# Patient Record
Sex: Female | Born: 1935 | Race: White | Hispanic: No | Smoking: Former smoker
Health system: Southern US, Community
[De-identification: ages and names within clinical notes are randomized; demographics above are authoritative.]

## PROBLEM LIST (undated history)

## (undated) ENCOUNTER — Emergency Department (HOSPITAL_COMMUNITY): Admission: EM | Disposition: A | Discharge status: Home / Self Care | Payer: Medicare PPO

## (undated) DIAGNOSIS — I10 Essential (primary) hypertension: Secondary | ICD-10-CM

## (undated) DIAGNOSIS — C50919 Malignant neoplasm of unspecified site of unspecified female breast: Secondary | ICD-10-CM

## (undated) DIAGNOSIS — I4891 Unspecified atrial fibrillation: Secondary | ICD-10-CM

## (undated) HISTORY — PX: MASTECTOMY: SHX3

## (undated) HISTORY — DX: Essential (primary) hypertension: I10

## (undated) HISTORY — PX: ABDOMINAL HYSTERECTOMY: SHX81

## (undated) HISTORY — DX: Malignant neoplasm of unspecified site of unspecified female breast: C50.919

---

## 1988-01-20 DIAGNOSIS — C50919 Malignant neoplasm of unspecified site of unspecified female breast: Secondary | ICD-10-CM | POA: Insufficient documentation

## 1988-01-20 HISTORY — DX: Malignant neoplasm of unspecified site of unspecified female breast: C50.919

## 1998-03-03 ENCOUNTER — Other Ambulatory Visit: Admission: RE | Admit: 1998-03-03 | Discharge: 1998-03-03 | Payer: Self-pay | Admitting: *Deleted

## 2000-04-06 ENCOUNTER — Encounter: Payer: Self-pay | Admitting: *Deleted

## 2000-04-06 ENCOUNTER — Encounter: Admission: RE | Admit: 2000-04-06 | Discharge: 2000-04-06 | Payer: Self-pay | Admitting: *Deleted

## 2000-10-05 ENCOUNTER — Encounter: Admission: RE | Admit: 2000-10-05 | Discharge: 2000-10-05 | Payer: Self-pay | Admitting: Internal Medicine

## 2000-10-05 ENCOUNTER — Encounter: Payer: Self-pay | Admitting: Internal Medicine

## 2000-10-26 ENCOUNTER — Encounter: Payer: Self-pay | Admitting: Internal Medicine

## 2000-10-26 ENCOUNTER — Encounter: Admission: RE | Admit: 2000-10-26 | Discharge: 2000-10-26 | Payer: Self-pay | Admitting: Internal Medicine

## 2001-04-18 ENCOUNTER — Encounter: Payer: Self-pay | Admitting: Internal Medicine

## 2001-04-18 ENCOUNTER — Encounter: Admission: RE | Admit: 2001-04-18 | Discharge: 2001-04-18 | Payer: Self-pay | Admitting: Internal Medicine

## 2002-04-30 ENCOUNTER — Encounter: Payer: Self-pay | Admitting: Internal Medicine

## 2002-04-30 ENCOUNTER — Encounter: Admission: RE | Admit: 2002-04-30 | Discharge: 2002-04-30 | Payer: Self-pay | Admitting: Internal Medicine

## 2002-07-03 ENCOUNTER — Ambulatory Visit (HOSPITAL_COMMUNITY): Admission: RE | Admit: 2002-07-03 | Discharge: 2002-07-03 | Payer: Self-pay | Admitting: Gastroenterology

## 2003-06-04 ENCOUNTER — Encounter: Admission: RE | Admit: 2003-06-04 | Discharge: 2003-06-04 | Payer: Self-pay | Admitting: Internal Medicine

## 2003-06-04 ENCOUNTER — Encounter: Payer: Self-pay | Admitting: Internal Medicine

## 2003-09-11 ENCOUNTER — Emergency Department (HOSPITAL_COMMUNITY): Admission: EM | Admit: 2003-09-11 | Discharge: 2003-09-11 | Payer: Self-pay | Admitting: Emergency Medicine

## 2004-06-07 ENCOUNTER — Encounter: Admission: RE | Admit: 2004-06-07 | Discharge: 2004-06-07 | Payer: Self-pay | Admitting: Surgery

## 2004-10-07 ENCOUNTER — Emergency Department (HOSPITAL_COMMUNITY): Admission: EM | Admit: 2004-10-07 | Discharge: 2004-10-07 | Payer: Self-pay | Admitting: Family Medicine

## 2004-10-09 ENCOUNTER — Emergency Department (HOSPITAL_COMMUNITY): Admission: AD | Admit: 2004-10-09 | Discharge: 2004-10-09 | Payer: Self-pay | Admitting: Family Medicine

## 2005-06-27 ENCOUNTER — Encounter: Admission: RE | Admit: 2005-06-27 | Discharge: 2005-06-27 | Payer: Self-pay | Admitting: Internal Medicine

## 2006-03-15 ENCOUNTER — Emergency Department (HOSPITAL_COMMUNITY): Admission: EM | Admit: 2006-03-15 | Discharge: 2006-03-15 | Payer: Self-pay | Admitting: Emergency Medicine

## 2006-07-11 ENCOUNTER — Encounter: Admission: RE | Admit: 2006-07-11 | Discharge: 2006-07-11 | Payer: Self-pay | Admitting: Surgery

## 2007-07-23 ENCOUNTER — Encounter: Admission: RE | Admit: 2007-07-23 | Discharge: 2007-07-23 | Payer: Self-pay | Admitting: Internal Medicine

## 2008-07-29 ENCOUNTER — Encounter: Admission: RE | Admit: 2008-07-29 | Discharge: 2008-07-29 | Payer: Self-pay | Admitting: Internal Medicine

## 2008-10-15 ENCOUNTER — Inpatient Hospital Stay (HOSPITAL_COMMUNITY): Admission: EM | Admit: 2008-10-15 | Discharge: 2008-10-17 | Payer: Self-pay | Admitting: Emergency Medicine

## 2009-09-01 ENCOUNTER — Encounter: Admission: RE | Admit: 2009-09-01 | Discharge: 2009-09-01 | Payer: Self-pay | Admitting: Surgery

## 2010-09-06 ENCOUNTER — Encounter
Admission: RE | Admit: 2010-09-06 | Discharge: 2010-09-06 | Payer: Self-pay | Source: Home / Self Care | Attending: Surgery | Admitting: Surgery

## 2010-12-14 LAB — BASIC METABOLIC PANEL
BUN: 10 mg/dL (ref 6–23)
BUN: 11 mg/dL (ref 6–23)
CO2: 27 mEq/L (ref 19–32)
CO2: 27 mEq/L (ref 19–32)
Calcium: 8 mg/dL — ABNORMAL LOW (ref 8.4–10.5)
Chloride: 92 mEq/L — ABNORMAL LOW (ref 96–112)
Chloride: 99 mEq/L (ref 96–112)
Creatinine, Ser: 0.66 mg/dL (ref 0.4–1.2)
Creatinine, Ser: 0.69 mg/dL (ref 0.4–1.2)
GFR calc Af Amer: >60 mL/min (ref >60)
Potassium: 4.1 mEq/L (ref 3.5–5.1)

## 2010-12-14 LAB — URINALYSIS, ROUTINE W REFLEX MICROSCOPIC
Bilirubin Urine: NEGATIVE
Bilirubin Urine: NEGATIVE
Hgb urine dipstick: NEGATIVE
Nitrite: NEGATIVE
Protein, ur: NEGATIVE mg/dL
Specific Gravity, Urine: 1.013 (ref 1.005–1.030)
Urobilinogen, UA: 0.2 mg/dL (ref 0.0–1.0)
pH: 8 (ref 5.0–8.0)

## 2010-12-14 LAB — CBC
HCT: 32 % — ABNORMAL LOW (ref 36.0–46.0)
Hemoglobin: 11.8 g/dL — ABNORMAL LOW (ref 12.0–15.0)
MCHC: 34.7 g/dL (ref 30.0–36.0)
MCHC: 34.8 g/dL (ref 30.0–36.0)
MCHC: 35 g/dL (ref 30.0–36.0)
MCV: 93.9 fL (ref 78.0–100.0)
Platelets: 173 10*3/uL (ref 150–400)
Platelets: 181 10*3/uL (ref 150–400)
RBC: 3.41 MIL/uL — ABNORMAL LOW (ref 3.87–5.11)
RBC: 3.65 MIL/uL — ABNORMAL LOW (ref 3.87–5.11)
RBC: 4.06 MIL/uL (ref 3.87–5.11)
WBC: 6.9 10*3/uL (ref 4.0–10.5)
WBC: 8.2 10*3/uL (ref 4.0–10.5)

## 2010-12-14 LAB — DIFFERENTIAL
Basophils Absolute: 0.1 10*3/uL (ref 0.0–0.1)
Basophils Relative: 2 % — ABNORMAL HIGH (ref 0–1)
Eosinophils Absolute: 0.1 10*3/uL (ref 0.0–0.7)
Lymphocytes Relative: 15 % (ref 12–46)
Lymphs Abs: 0.9 10*3/uL (ref 0.7–4.0)
Lymphs Abs: 1.3 10*3/uL (ref 0.7–4.0)
Monocytes Relative: 8 % (ref 3–12)
Neutrophils Relative %: 74 % (ref 43–77)

## 2010-12-14 LAB — COMPREHENSIVE METABOLIC PANEL
ALT: 20 U/L (ref 0–35)
AST: 26 U/L (ref 0–37)
Albumin: 3.2 g/dL — ABNORMAL LOW (ref 3.5–5.2)
BUN: 10 mg/dL (ref 6–23)
CO2: 28 mEq/L (ref 19–32)
Calcium: 8.3 mg/dL — ABNORMAL LOW (ref 8.4–10.5)
Calcium: 9 mg/dL (ref 8.4–10.5)
Chloride: 86 mEq/L — ABNORMAL LOW (ref 96–112)
Creatinine, Ser: 0.76 mg/dL (ref 0.4–1.2)
GFR calc Af Amer: >60 mL/min (ref >60)
GFR calc non Af Amer: >60 mL/min (ref >60)
GFR calc non Af Amer: >60 mL/min (ref >60)
Glucose, Bld: 110 mg/dL — ABNORMAL HIGH (ref 70–99)
Sodium: 124 mEq/L — ABNORMAL LOW (ref 135–145)

## 2010-12-14 LAB — PROTIME-INR
INR: 1.2 (ref 0.00–1.49)
Prothrombin Time: 15.2 seconds (ref 11.6–15.2)

## 2010-12-14 LAB — URINE CULTURE: Culture: NO GROWTH

## 2010-12-14 LAB — CORTISOL: Cortisol, Plasma: 24.5 ug/dL

## 2010-12-14 LAB — URINE MICROSCOPIC-ADD ON

## 2011-01-11 NOTE — Discharge Summary (Signed)
NAMEMOE, Wolfe NO.:  [id number]   MEDICAL RECORD NO.:  [id number]          PATIENT TYPE:  INP   LOCATION:  3731                         FACILITY:  [facility]   PHYSICIAN:  Thora Lance, M.D.  DATE OF BIRTH:  [birth year]   DATE OF ADMISSION:  10/14/2008  DATE OF DISCHARGE:  10/17/2008                               DISCHARGE SUMMARY   REASON FOR ADMISSION:  This is a 77 year old white female who presented  with back pain for several days prior to admission.  She went to the  emergency room where an x-ray showed possible L2 acute compression  fracture.  She also had some constipation.   SIGNIFICANT FINDINGS:  VITAL SIGNS:  Temperature 97.8, blood pressure  197/89, down to 164/72, heart rate 95, and respirations 18.  LUNGS:  Clear.  HEART:  Regular rate and rhythm without murmur, gallop, or rub.  ABDOMEN:  Soft, nontender, and nondistended.  EXTREMITIES:  No edema.  BACK:  Mild vertebral tenderness around the upper lumbar spine.   LABORATORY DATA:  White blood cell count 8.2, hemoglobin 13.3.  Sodium  124, potassium 4.5, creatinine 0.76.  Chest x-ray showed osteopenia,  otherwise no acute disease.  Lumbar spine showed an L2 fracture of  thoracic spine.  X-ray showed loss of height at T8.  CT scan of the  abdomen and pelvis showed no acute findings other than obstipation.   HOSPITAL COURSE:  1. Back pain.  The patient was admitted with back pain.  She had a      probable acute L2 osteoporosis related compression fracture.  She      has had history of severe osteoporosis with compression fractures      in the past.  In the hospital her pain was relatively mild.  It was      treated successfully with Ultram and heat.  She was able to      ambulate without difficulty on the day prior to admission by      physical therapy.  Further physical therapy was not recommended.      The patient will be treated conservatively at home.  2. Constipation.  The patient was  given MiraLax and Colace and had      several bowel movements during the hospitalization.  3. Hyponatremia.  The patient was given normal saline with gradual      improvement in her hyponatremia.  The cause of her hyponatremia is      felt to be related to hydrochlorothiazide and this was      discontinued.  Her TSH was borderline elevated at 5.6 and will need      to be followed as an outpatient.  4. Multifocal atrial tachycardia.  The patient had MAT on admission,      which has been a rhythm she has had in the past.  This converted to      normal sinus rhythm and remains the same throughout the      hospitalization.   DISCHARGE DIAGNOSES:  1. L2 osteoporosis related compression fracture.  2. Severe osteoporosis.  3. Constipation.  4. Hyponatremia.  5. Multifocal atrial tachycardia.  6. Hypertension.   PROCEDURES:  None.   DISCHARGE MEDICATIONS:  1. Amlodipine 5 mg p.o. daily.  2. Lisinopril 20 mg p.o. daily.  3. Metoprolol 50 mg one-half daily.  4. Digoxin 0.25 mg one-half daily.  5. Calcium plus D 600 mg twice a day.  6. Alendronate 70 mg once a week.  7. Evista 60 mg once a day.  8. Aspirin 81 mg once a day.  9. Tramadol 50 mg one q.i.d. p.r.n. for pain.  10.Colace 100 mg b.i.d.  11.MiraLax 17 g with water daily p.r.n. for constipation.   DISPOSITION:  Discharged to home.   FOLLOWUP:  On February 24, with Dr. Valentina Lucks.   CODE STATUS:  Full code.   DIET:  Low-sodium diet.           ______________________________  Thora Lance, M.D.     JJG/MEDQ  D:  10/17/2008  T:  10/17/2008  Job:  [id number]

## 2011-01-11 NOTE — H&P (Signed)
NAMESHAMONA, WIRTZ                  ACCOUNT NO.:  [id number]   MEDICAL RECORD NO.:  [id number]          PATIENT TYPE:  INP   LOCATION:  1857                         FACILITY:  [facility]   PHYSICIAN:  Michiel Cowboy, MDDATE OF BIRTH:  [birth year]   DATE OF ADMISSION:  10/14/2008  DATE OF DISCHARGE:                              HISTORY & PHYSICAL   PRIMARY CARE Osiah Haring:  Thora Lance, M.D.   CHIEF COMPLAINT:  Back pain.   HISTORY OF THE PRESENT ILLNESS:  The patient is an 77 year old female  with a past medical history of severe osteoporosis and hypertension who  presents with complaints if back pain.  The patient does not recall any  history of back injury, but started having back pain for the past few  days.  She presented to her primary care Tisheena Maguire, but was not able to  get much of relief.  She continued to have severe back pain and went to  the emergency department where images of her back showed possible L2  acute fracture and questionable T8 fracture.  The L2 is  more likely to  be the acute one.  She states sometimes the pain in her back radiates to  her right upper abdomen.  It is mostly related to movement.  There is no  history of pleurisy, no shortness of breath, no chest pain, no fevers,  and no chills.  Of note, the patient had dropped a piece of wood on her  anterior right leg and supposedly this was cleaned by Urgent Care.  However, while she was putting her pants on, she pulled on the scab and  took off a large portion on the scab on her leg, which now has some  drainage from the injury.   REVIEW OF SYSTEMS:  Otherwise the review of systems is negative except  for that noted in the HPI.   PAST MEDICAL HISTORY:  The past medical history is significant for:  1. Osteoporosis.  2. Hypertension.  3. Arthritis.   SOCIAL HISTORY:  The patient does not smoke or use drugs.  She lives at  home alone.  She has a son who is very attentive.   ALLERGIES:  No known drug  allergies.   MEDICATIONS:  1. Fosamax 70 mg every weekly.  2. Norvasc 5 mg daily.  3. Evista; dose unknown.  4. Lisinopril; dose unknown.  The patient may be confused about the      name of the medications.  Her son will bring them in tomorrow.   PHYSICAL EXAMINATION:  VITAL SIGNS:  Temperature 97.8.  Blood pressure  was 197/89 and is now down to 164/72.  Pulse initially was 108 and is  now down to 95.  Respirations are 18 and she is satting at 97% on room  air.  GENERAL APPEARANCE:  The patient appears to be in no acute distress.  She is comfortable lying down, but gets pain if she tries to ambulate.  HEENT:  Head is atraumatic.  Moist mucous membranes.  LUNGS:  The lungs are clear to auscultation bilaterally.  HEART:  The heart has a regular rate and rhythm.  No murmurs are  appreciated.  ABDOMEN:  The abdomen is soft, nontender and nondistended.  EXTREMITIES:  The lower extremities are without clubbing or cyanosis,  but there is mild edema of the right lower extremity around the wound  with about an 8 x 3 cm scrape with some yellowish discharge.  BACK:  There is a vertebral tenderness around the lumbar spine noted,  but this is mild.  NEUROLOGIC EXAMINATION:  Neurologically she appears to be intact.   LABORATORY DATA:  White blood count 8.2 and hemoglobin 13.3.  Sodium  124, potassium 4.5 and creatinine 0.76.  LFTs are within normal limits.  UA shows 3-6 white blood cells and rare bacteria, but nothing definitive  for a urinary tract infection.  Chest x-ray showed no osteopenia and  otherwise no acute cardiopulmonary disease.  Lumbar spine showed an L2  fracture.  Thoracic spine showed loss of height at T8.  CT scan of the  abdomen and pelvis showed acute versus subacute L2 fracture and  obstipation.   ASSESSMENT AND PLAN:  This is an 77 year old female with a lumbar 2 and  possibly a thoracic 8 fracture with pain and a mild scrape on her leg  that could be superficially  infected, and obstipation.  1. Lumbar 2 fracture.  I will consult radiology in the morning for      possible vertebroplasty.  In the meanwhile we will control pain      with morphine and Ultram.  2. Obstipation.  We will use Colace and MiraLax.  If no improvement we      may need to give her an enema.  The patient has had regular bowel      movements, but they were low in volume and she appears to be still      very constipated.  3. Hypertension.  We will continue Norvasc for now and we will      increase the dose since she is hypertensive probably secondary to      pain.  We will need to get clarification of her home medications of      what she is suppose to be taking.  4. Prophylaxes.  Protonix and sequential compressive devices until      radiology can tell us if, and when vertebroplasty can be done.  5. Hyponatremia.  Given the patient was mildly tachycardiac initially      she may be slightly dehydrated.  We will check orthostatics and      give gentle intravenous fluids.  We will check urine electrolytes,      thyroid stimulating hormone and cortisol levels.  Sometimes      syndrome of inappropriate antidiuretic hormone could be secondary      to severe pain.  The patient does not appear to be in as much      pain.  Her hyponatremia could be also chronic.   Dr. Kirby Funk will assume the patient's care in the morning.      Michiel Cowboy, MD  Electronically Signed     AVD/MEDQ  D:  10/15/2008  T:  10/15/2008  Job:  [id number]   cc:   Thora Lance, M.D.

## 2011-01-14 NOTE — Op Note (Signed)
   Tina Wolfe, Tina Wolfe NO.:  [id number]   MEDICAL RECORD NO.:  [id number]                    PATIENT TYPE:   LOCATION:                                       FACILITY:   PHYSICIAN:  Danise Edge, M.D.                DATE OF BIRTH:   DATE OF PROCEDURE:  07/03/2002  DATE OF DISCHARGE:                                 OPERATIVE REPORT   PROCEDURE PERFORMED:  Colonoscopy.   ENDOSCOPIST:  Charolett Bumpers, M.D.   INDICATIONS FOR PROCEDURE:  The patient is a 77 year old female.  She was  born [birth year].  The patient has intermittent constipation followed by  diarrhea which has markedly improved since starting Metamucil capsules.  She  is scheduled to undergo a screening colonoscopy with polypectomy to prevent  colon cancer.   I discussed with the patient complications associated with colonoscopy and  polypectomy including a 15 per 1000 risk of bleeding and one per 1000 risk  of colon perforation requiring surgical repair.  The patient has signed the  operative permit.   PREMEDICATION:  Demerol 50 mg, Versed 7.5 mg.   INSTRUMENT USED:  Pediatric Olympus colonoscope.   DESCRIPTION OF PROCEDURE:  After obtaining informed consent, the patient was  placed in the left lateral decubitus position.  I administered intravenous  Demerol and intravenous Versed to achieve conscious sedation for the  procedure.  The patient's blood pressure, oxygen saturations and cardiac  rhythm were monitored throughout the procedure and documented in the medical  record.   Anal inspection was normal.  Digital rectal exam was normal.  The pediatric  Olympus video colonoscope was introduced into the rectum and advanced to the  cecum.  Colonic preparation for the exam today was excellent.   Rectum:  Normal.   Sigmoid colon and descending colon:  Left colonic diverticulosis.   Splenic flexure:  Normal.   Transverse colon:  Normal.   Hepatic flexure:  Normal.   Ascending colon:  Normal.   Ileocecal valve and cecum:  Normal.    ASSESSMENT:  Left colonic diverticulosis; otherwise normal proctocolonoscopy  to the cecum.   PLAN:                                                Danise Edge, M.D.    MJ/MEDQ  D:  07/03/2002  T:  07/03/2002  Job:  [id number]   cc:   Thora Lance, M.D.  [address]  [place]  [place] [address]  Fax: [phone]

## 2011-01-21 ENCOUNTER — Encounter (INDEPENDENT_AMBULATORY_CARE_PROVIDER_SITE_OTHER): Payer: Self-pay | Admitting: Surgery

## 2011-08-19 ENCOUNTER — Encounter (INDEPENDENT_AMBULATORY_CARE_PROVIDER_SITE_OTHER): Payer: Self-pay | Admitting: General Surgery

## 2011-08-25 ENCOUNTER — Encounter (INDEPENDENT_AMBULATORY_CARE_PROVIDER_SITE_OTHER): Payer: Self-pay | Admitting: Surgery

## 2011-08-29 ENCOUNTER — Ambulatory Visit (INDEPENDENT_AMBULATORY_CARE_PROVIDER_SITE_OTHER): Payer: Medicare Other | Admitting: Surgery

## 2011-08-29 ENCOUNTER — Encounter (INDEPENDENT_AMBULATORY_CARE_PROVIDER_SITE_OTHER): Payer: Self-pay | Admitting: Surgery

## 2011-08-29 ENCOUNTER — Other Ambulatory Visit (INDEPENDENT_AMBULATORY_CARE_PROVIDER_SITE_OTHER): Payer: Self-pay | Admitting: Surgery

## 2011-08-29 VITALS — BP 130/78 | HR 84 | Temp 97.8°F | Resp 14 | Ht 62.5 in | Wt 114.0 lb

## 2011-08-29 DIAGNOSIS — Z853 Personal history of malignant neoplasm of breast: Secondary | ICD-10-CM

## 2011-08-29 DIAGNOSIS — Z9011 Acquired absence of right breast and nipple: Secondary | ICD-10-CM

## 2011-08-29 DIAGNOSIS — Z1231 Encounter for screening mammogram for malignant neoplasm of breast: Secondary | ICD-10-CM

## 2011-08-29 NOTE — Progress Notes (Signed)
[name]       DOB: [birth year]           DATE: 08/29/2011       MRN: [id number]   Tina Wolfe is a 77 y.o.Marland Kitchenfemale who presents for routine followup of her Right breast cancer diagnosed in 1989 and treated with mastectomy and implant reconstruction. She has no problems or concerns on either side.  PFSH: She has had no significant changes since the last visit here.  ROS: There have been no significant changes since the last visit here  EXAM: General: The patient is alert, oriented, generally healty appearing, NAD. Mood and affect are normal.  Breasts:  Right s/p mastectomy with reconstruction. Left normal. No suggestion of a problem  Lymphatics: She has no axillary or supraclavicular adenopathy on either side.  Extremities: Full ROM of the surgical side with no lymphedema noted.  Data Reviewed: Mammogram Jan 2012 negative  Impression: Doing well, with no evidence of recurrent cancer or new cancer  Plan: RTC PRN. Continue annual mammograms.

## 2011-08-29 NOTE — Patient Instructions (Signed)
We will see you again on an as needed basis. Please call the office at [phone] if you have any questions or concerns. Thank you for allowing us to take care of you.  

## 2011-09-23 ENCOUNTER — Ambulatory Visit: Payer: Self-pay

## 2011-10-06 ENCOUNTER — Ambulatory Visit
Admission: RE | Admit: 2011-10-06 | Discharge: 2011-10-06 | Disposition: A | Discharge status: Home / Self Care | Payer: Medicare Other | Source: Ambulatory Visit | Attending: Surgery | Admitting: Surgery

## 2011-10-06 DIAGNOSIS — Z9011 Acquired absence of right breast and nipple: Secondary | ICD-10-CM

## 2011-10-06 DIAGNOSIS — Z1231 Encounter for screening mammogram for malignant neoplasm of breast: Secondary | ICD-10-CM

## 2012-09-21 ENCOUNTER — Other Ambulatory Visit (INDEPENDENT_AMBULATORY_CARE_PROVIDER_SITE_OTHER): Payer: Self-pay | Admitting: Surgery

## 2012-09-21 DIAGNOSIS — Z1231 Encounter for screening mammogram for malignant neoplasm of breast: Secondary | ICD-10-CM

## 2012-10-25 ENCOUNTER — Ambulatory Visit
Admission: RE | Admit: 2012-10-25 | Discharge: 2012-10-25 | Disposition: A | Discharge status: Home / Self Care | Payer: Medicare PPO | Source: Ambulatory Visit | Attending: Surgery | Admitting: Surgery

## 2013-10-07 ENCOUNTER — Other Ambulatory Visit: Payer: Self-pay | Admitting: Internal Medicine

## 2013-10-07 ENCOUNTER — Ambulatory Visit
Admission: RE | Admit: 2013-10-07 | Discharge: 2013-10-07 | Disposition: A | Discharge status: Home / Self Care | Payer: Medicare PPO | Source: Ambulatory Visit | Attending: Internal Medicine | Admitting: Internal Medicine

## 2013-10-07 DIAGNOSIS — M545 Low back pain, unspecified: Secondary | ICD-10-CM

## 2013-11-07 ENCOUNTER — Emergency Department (HOSPITAL_COMMUNITY)
Admission: EM | Admit: 2013-11-07 | Discharge: 2013-11-07 | Disposition: A | Discharge status: Other Acute Inpatient Hospital | Payer: Medicare PPO | Source: Home / Self Care

## 2013-11-07 ENCOUNTER — Encounter (HOSPITAL_COMMUNITY): Payer: Self-pay | Admitting: Emergency Medicine

## 2013-11-07 ENCOUNTER — Observation Stay (HOSPITAL_COMMUNITY)
Admission: EM | Admit: 2013-11-07 | Discharge: 2013-11-10 | Disposition: A | Discharge status: Home / Self Care | Payer: Medicare PPO | Attending: Internal Medicine | Admitting: Internal Medicine

## 2013-11-07 ENCOUNTER — Emergency Department (HOSPITAL_COMMUNITY): Payer: Medicare PPO

## 2013-11-07 DIAGNOSIS — I10 Essential (primary) hypertension: Secondary | ICD-10-CM | POA: Diagnosis present

## 2013-11-07 DIAGNOSIS — R002 Palpitations: Secondary | ICD-10-CM

## 2013-11-07 DIAGNOSIS — R51 Headache: Secondary | ICD-10-CM | POA: Insufficient documentation

## 2013-11-07 DIAGNOSIS — R000 Tachycardia, unspecified: Secondary | ICD-10-CM

## 2013-11-07 DIAGNOSIS — Z87891 Personal history of nicotine dependence: Secondary | ICD-10-CM | POA: Insufficient documentation

## 2013-11-07 DIAGNOSIS — IMO0002 Reserved for concepts with insufficient information to code with codable children: Secondary | ICD-10-CM | POA: Insufficient documentation

## 2013-11-07 DIAGNOSIS — Z853 Personal history of malignant neoplasm of breast: Secondary | ICD-10-CM | POA: Insufficient documentation

## 2013-11-07 DIAGNOSIS — Z79899 Other long term (current) drug therapy: Secondary | ICD-10-CM | POA: Insufficient documentation

## 2013-11-07 DIAGNOSIS — C50919 Malignant neoplasm of unspecified site of unspecified female breast: Secondary | ICD-10-CM | POA: Diagnosis present

## 2013-11-07 DIAGNOSIS — I4891 Unspecified atrial fibrillation: Secondary | ICD-10-CM

## 2013-11-07 DIAGNOSIS — R519 Headache, unspecified: Secondary | ICD-10-CM

## 2013-11-07 DIAGNOSIS — R609 Edema, unspecified: Secondary | ICD-10-CM | POA: Insufficient documentation

## 2013-11-07 LAB — COMPREHENSIVE METABOLIC PANEL
ALT: 17 U/L (ref 0–35)
AST: 25 U/L (ref 0–37)
Albumin: 3.8 g/dL (ref 3.5–5.2)
Alkaline Phosphatase: 41 U/L (ref 39–117)
BILIRUBIN TOTAL: 0.4 mg/dL (ref 0.3–1.2)
BUN: 25 mg/dL — ABNORMAL HIGH (ref 6–23)
CHLORIDE: 97 meq/L (ref 96–112)
CO2: 26 meq/L (ref 19–32)
Calcium: 9.5 mg/dL (ref 8.4–10.5)
Creatinine, Ser: 0.61 mg/dL (ref 0.50–1.10)
GFR calc Af Amer: >90 mL/min (ref >90)
GFR, EST NON AFRICAN AMERICAN: 79 mL/min — AB (ref >90)
Glucose, Bld: 128 mg/dL — ABNORMAL HIGH (ref 70–99)
POTASSIUM: 4.3 meq/L (ref 3.7–5.3)
SODIUM: 137 meq/L (ref 137–147)
Total Protein: 7.8 g/dL (ref 6.0–8.3)

## 2013-11-07 LAB — I-STAT TROPONIN, ED: Troponin i, poc: 0 ng/mL (ref 0.00–0.08)

## 2013-11-07 LAB — CBC WITH DIFFERENTIAL/PLATELET
BASOS ABS: 0 10*3/uL (ref 0.0–0.1)
Basophils Relative: 0 % (ref 0–1)
Eosinophils Absolute: 0.1 10*3/uL (ref 0.0–0.7)
Eosinophils Relative: 1 % (ref 0–5)
HEMATOCRIT: 43 % (ref 36.0–46.0)
Hemoglobin: 14.5 g/dL (ref 12.0–15.0)
LYMPHS PCT: 17 % (ref 12–46)
Lymphs Abs: 1.2 10*3/uL (ref 0.7–4.0)
MCH: 30.9 pg (ref 26.0–34.0)
MCHC: 33.7 g/dL (ref 30.0–36.0)
MCV: 91.7 fL (ref 78.0–100.0)
Monocytes Absolute: 0.7 10*3/uL (ref 0.1–1.0)
Monocytes Relative: 10 % (ref 3–12)
NEUTROS ABS: 5.3 10*3/uL (ref 1.7–7.7)
Neutrophils Relative %: 72 % (ref 43–77)
PLATELETS: 177 10*3/uL (ref 150–400)
RBC: 4.69 MIL/uL (ref 3.87–5.11)
RDW: 14.5 % (ref 11.5–15.5)
WBC: 7.3 10*3/uL (ref 4.0–10.5)

## 2013-11-07 LAB — DIGOXIN LEVEL: Digoxin Level: 0.6 ng/mL — ABNORMAL LOW (ref 0.8–2.0)

## 2013-11-07 LAB — PROTIME-INR
INR: 1.08 (ref 0.00–1.49)
PROTHROMBIN TIME: 13.8 s (ref 11.6–15.2)

## 2013-11-07 LAB — MAGNESIUM: Magnesium: 2 mg/dL (ref 1.5–2.5)

## 2013-11-07 LAB — TROPONIN I

## 2013-11-07 LAB — PRO B NATRIURETIC PEPTIDE: Pro B Natriuretic peptide (BNP): 740.7 pg/mL — ABNORMAL HIGH (ref 0–450)

## 2013-11-07 LAB — TSH: TSH: 4.316 u[IU]/mL (ref 0.350–4.500)

## 2013-11-07 MED ORDER — FLUTICASONE PROPIONATE 50 MCG/ACT NA SUSP
2.0000 | Freq: Every day | NASAL | Status: DC | PRN
  Filled 2013-11-07: qty 16

## 2013-11-07 MED ORDER — SODIUM CHLORIDE 0.9 % IJ SOLN
3.0000 mL | Freq: Two times a day (BID) | INTRAMUSCULAR | Status: DC
  Administered 2013-11-07 – 2013-11-09 (×4): 3 mL via INTRAVENOUS

## 2013-11-07 MED ORDER — SODIUM CHLORIDE 0.9 % IJ SOLN: 3.0000 mL | INTRAMUSCULAR | Status: DC | PRN

## 2013-11-07 MED ORDER — LISINOPRIL 10 MG PO TABS
10.0000 mg | ORAL_TABLET | Freq: Every day | ORAL | Status: DC
  Administered 2013-11-08 – 2013-11-10 (×3): 10 mg via ORAL
  Filled 2013-11-07 (×3): qty 1

## 2013-11-07 MED ORDER — ADULT MULTIVITAMIN W/MINERALS CH
1.0000 | ORAL_TABLET | Freq: Every day | ORAL | Status: DC
  Administered 2013-11-08 – 2013-11-10 (×3): 1 via ORAL
  Filled 2013-11-07 (×3): qty 1

## 2013-11-07 MED ORDER — ACETAMINOPHEN 500 MG PO TABS
500.0000 mg | ORAL_TABLET | Freq: Four times a day (QID) | ORAL | Status: DC | PRN
  Administered 2013-11-07: 500 mg via ORAL
  Filled 2013-11-07: qty 1

## 2013-11-07 MED ORDER — AMLODIPINE BESYLATE 5 MG PO TABS
5.0000 mg | ORAL_TABLET | Freq: Every day | ORAL | Status: DC
  Administered 2013-11-08 – 2013-11-10 (×3): 5 mg via ORAL
  Filled 2013-11-07 (×3): qty 1

## 2013-11-07 MED ORDER — DILTIAZEM LOAD VIA INFUSION
5.0000 mg | Freq: Once | INTRAVENOUS | Status: DC
  Filled 2013-11-07: qty 5

## 2013-11-07 MED ORDER — DIGOXIN 250 MCG PO TABS
250.0000 ug | ORAL_TABLET | Freq: Every day | ORAL | Status: DC
  Administered 2013-11-08 – 2013-11-10 (×3): 250 ug via ORAL
  Filled 2013-11-07 (×3): qty 1

## 2013-11-07 MED ORDER — ZOLPIDEM TARTRATE 5 MG PO TABS
5.0000 mg | ORAL_TABLET | Freq: Every evening | ORAL | Status: DC | PRN
Start: 2013-11-07 — End: 2013-11-10
  Administered 2013-11-07: 5 mg via ORAL
  Filled 2013-11-07: qty 1

## 2013-11-07 MED ORDER — TRIAMCINOLONE ACETONIDE 55 MCG/ACT NA AERO: 2.0000 | INHALATION_SPRAY | Freq: Every day | NASAL | Status: DC | PRN

## 2013-11-07 MED ORDER — SODIUM CHLORIDE 0.9 % IV SOLN
Freq: Once | INTRAVENOUS | Status: AC
  Administered 2013-11-07: 16:00:00 via INTRAVENOUS

## 2013-11-07 MED ORDER — METOPROLOL TARTRATE 25 MG PO TABS
25.0000 mg | ORAL_TABLET | Freq: Once | ORAL | Status: AC
  Administered 2013-11-07: 25 mg via ORAL

## 2013-11-07 MED ORDER — ASPIRIN 81 MG PO CHEW
324.0000 mg | CHEWABLE_TABLET | Freq: Once | ORAL | Status: AC
  Administered 2013-11-07: 324 mg via ORAL
  Filled 2013-11-07: qty 4

## 2013-11-07 MED ORDER — SODIUM CHLORIDE 0.9 % IV SOLN: 250.0000 mL | INTRAVENOUS | Status: DC | PRN

## 2013-11-07 MED ORDER — ENOXAPARIN SODIUM 60 MG/0.6ML ~~LOC~~ SOLN
1.0000 mg/kg | Freq: Two times a day (BID) | SUBCUTANEOUS | Status: DC
  Administered 2013-11-07: 55 mg via SUBCUTANEOUS
  Filled 2013-11-07 (×3): qty 0.6

## 2013-11-07 MED ORDER — RALOXIFENE HCL 60 MG PO TABS
60.0000 mg | ORAL_TABLET | Freq: Every day | ORAL | Status: DC
  Administered 2013-11-08 – 2013-11-10 (×3): 60 mg via ORAL
  Filled 2013-11-07 (×3): qty 1

## 2013-11-07 MED ORDER — SODIUM CHLORIDE 0.9 % IJ SOLN
3.0000 mL | Freq: Two times a day (BID) | INTRAMUSCULAR | Status: DC
  Administered 2013-11-08: 3 mL via INTRAVENOUS

## 2013-11-07 MED ORDER — DILTIAZEM HCL 25 MG/5ML IV SOLN
5.0000 mg | Freq: Once | INTRAVENOUS | Status: AC
  Administered 2013-11-07: 5 mg via INTRAVENOUS
  Filled 2013-11-07: qty 5

## 2013-11-07 NOTE — ED Notes (Signed)
Iv  Ns  tko   Rate  50  Ml       Via  20  Angio  l  Arm      1  Att           Site  Is  Patent      Pt is  On  Cardiac monitor  And  Nasal  o2  At  2 l /  Min

## 2013-11-07 NOTE — ED Provider Notes (Signed)
CSN: [id number]     Arrival date & time 11/07/13  1559 History   First MD Initiated Contact with Patient 11/07/13 1639     Chief Complaint  Patient presents with  . Atrial Fibrillation     (Consider location/radiation/quality/duration/timing/severity/associated sxs/prior Treatment) HPI Comments: 80 yo female with PMH of HTN, Breast CA presents to ER with cc of fast heart rate.  She was seen at [facility] today and found to be in Afib with RVR.  Transferred to ER for evaluation.    EKG at [facility] - Afib with RVR with rate at 145.    Pt reports no previous h/o Afib.    NKDA. Meds: norvasc, fosamax, lisinopril, evista    Patient is a 80 y.o. female presenting with atrial fibrillation. The history is provided by the patient and the EMS personnel.  Atrial Fibrillation This is a new problem. The current episode started 2 days ago. The problem occurs constantly. The problem has not changed since onset.Associated symptoms include headaches. Pertinent negatives include no chest pain, no abdominal pain and no shortness of breath. Nothing aggravates the symptoms. Nothing relieves the symptoms. She has tried nothing for the symptoms.    Past Medical History  Diagnosis Date  . Hypertension   . Breast cancer, right, intraductal 01/20/1988   Past Surgical History  Procedure Laterality Date  . Abdominal hysterectomy    . Mastectomy     No family history on file. History  Substance Use Topics  . Smoking status: Former Research scientist (life sciences)  . Smokeless tobacco: Not on file  . Alcohol Use: No   OB History   Grav Para Term Preterm Abortions TAB SAB Ect Mult Living                 Review of Systems  Constitutional: Negative.   Eyes: Negative.   Respiratory: Negative for cough, choking, chest tightness, shortness of breath and stridor.   Cardiovascular: Positive for palpitations. Negative for chest pain and leg swelling.  Gastrointestinal: Negative for abdominal pain.  Endocrine: Negative.   Genitourinary:  Negative.   Allergic/Immunologic: Negative.   Neurological: Positive for headaches. Negative for tremors, seizures, syncope, facial asymmetry, speech difficulty, light-headedness and numbness.      Allergies  Review of patient's allergies indicates no known allergies.  Home Medications   Current Outpatient Rx  Name  Route  Sig  Dispense  Refill  . acetaminophen (TYLENOL) 500 MG tablet   Oral   Take 500 mg by mouth every 6 (six) hours as needed for moderate pain.         Marland Kitchen amLODipine (NORVASC) 5 MG tablet   Oral   Take 5 mg by mouth daily.           Marland Kitchen [place] 250 MCG tablet   Oral   Take 250 mcg by mouth daily.         Marland Kitchen lisinopril (PRINIVIL,ZESTRIL) 10 MG tablet   Oral   Take 10 mg by mouth daily.           . metoprolol succinate (TOPROL-XL) 25 MG 24 hr tablet   Oral   Take 25 mg by mouth every morning.          . Multiple Vitamin (MULTIVITAMIN WITH MINERALS) TABS tablet   Oral   Take 1 tablet by mouth daily.         . Omega-3 Fatty Acids (FISH OIL) 1000 MG CAPS   Oral   Take 1,000 mg by mouth daily.         Marland Kitchen  raloxifene (EVISTA) 60 MG tablet   Oral   Take 60 mg by mouth daily.           Marland Kitchen triamcinolone (NASACORT ALLERGY 24HR) 55 MCG/ACT AERO nasal inhaler   Nasal   Place 2 sprays into the nose daily as needed (for allergies).          BP 172/76  Pulse 125  Temp(Src) 97.9 F (36.6 C) (Oral)  Resp 25  SpO2 100% Physical Exam  Nursing note and vitals reviewed. Constitutional: She is oriented to person, place, and time. She appears well-developed and well-nourished.  HENT:  Head: Normocephalic and atraumatic.  Eyes: Conjunctivae are normal. Right eye exhibits no discharge. Left eye exhibits no discharge.  Neck: Normal range of motion. Neck supple.  Pulmonary/Chest: Effort normal and breath sounds normal. She has no wheezes. She has no rales.  Abdominal: Soft. Bowel sounds are normal. She exhibits no distension and no mass. There is no  tenderness. There is no rebound and no guarding.  Musculoskeletal: Normal range of motion. She exhibits edema. She exhibits no tenderness.  Mild bilat LE edema  Neurological: She is alert and oriented to person, place, and time. She has normal reflexes.  Skin: Skin is warm and dry.    ED Course  Procedures (including critical care time) Labs Review Labs Reviewed  CBC WITH DIFFERENTIAL  COMPREHENSIVE METABOLIC PANEL  I-STAT [place], ED   Imaging Review No results found.   EKG Interpretation   Date/Time:  Thursday November 07 2013 16:16:52 EDT Ventricular Rate:  113 PR Interval:  161 QRS Duration: 80 QT Interval:  302 QTC Calculation: 414 R Axis:   20 Text Interpretation:  Sinus tachycardia with irregular rate Minimal ST  depression, lateral leads Confirmed by [facility]  MD, DAVID ([id number]) on  11/07/2013 4:31:29 PM      MDM   Final diagnoses:  A-fib  Tachycardia    80 yo female with new onset Afib with RVR.  HR improved to low 110s without treatment.  Pt given Dilt 5mg  x 1 and her heart rate impoved.  ER w/u - trop neg, EKG w/o sns of ischemia, CXR without acute findings. CBC, CMP ok .  Pt in NAD.   CRITICAL CARE Performed by: Curly Rim, DAVID   Total critical care time: 45  Critical care time was exclusive of separately billable procedures and treating other patients.  Critical care was necessary to treat or prevent imminent or life-threatening deterioration.  Critical care was time spent personally by me on the following activities: development of treatment plan with patient and/or surrogate as well as nursing, discussions with consultants, evaluation of patient's response to treatment, examination of patient, obtaining history from patient or surrogate, ordering and performing treatments and interventions, ordering and review of laboratory studies, ordering and review of radiographic studies, pulse oximetry and re-evaluation of patient's condition.  7:06 PM Case d/w  cardiology who recommended internal medicine admission.    Consulted medicine for admit. New onset Afib.      Elmer Sow, MD 11/08/13 [phone]

## 2013-11-07 NOTE — ED Notes (Signed)
Called [facility] for transport 

## 2013-11-07 NOTE — ED Provider Notes (Signed)
CSN: [id number]     Arrival date & time 11/07/13  1450 History   First MD Initiated Contact with Patient 11/07/13 1458     Chief Complaint  Patient presents with  . Irregular Heart Beat   (Consider location/radiation/quality/duration/timing/severity/associated sxs/prior Treatment) HPI Comments: 80 year old female presents with a complaint of rapid heartbeat. The sensation was perceived  yesterday by her trembling hands. She also complains of a headache for which she has had for at least 2-3 days. Denies chest pain, heaviness, tightness, fullness or pressure. Denies shortness of breath, nausea, vomiting or diaphoresis.     Past Medical History  Diagnosis Date  . Hypertension   . Breast cancer, right, intraductal 01/20/1988   Past Surgical History  Procedure Laterality Date  . Abdominal hysterectomy    . Mastectomy     History reviewed. No pertinent family history. History  Substance Use Topics  . Smoking status: Former Research scientist (life sciences)  . Smokeless tobacco: Not on file  . Alcohol Use: No   OB History   Grav Para Term Preterm Abortions TAB SAB Ect Mult Living                 Review of Systems  Constitutional: Positive for activity change. Negative for fever, chills, diaphoresis, appetite change and fatigue.  HENT: Negative.   Eyes: Negative.   Respiratory: Negative for apnea, cough, choking, chest tightness, shortness of breath, wheezing and stridor.   Cardiovascular: Negative for chest pain and leg swelling.  Gastrointestinal: Negative.   Genitourinary: Negative.   Musculoskeletal: Negative.   Skin: Negative for color change and pallor.  Neurological: Negative.     Allergies  Review of patient's allergies indicates no known allergies.  Home Medications   Current Outpatient Rx  Name  Route  Sig  Dispense  Refill  . alendronate (FOSAMAX) 10 MG tablet   Oral   Take 10 mg by mouth daily before breakfast. Take with a full glass of water on an empty stomach.          Marland Kitchen  amLODipine (NORVASC) 5 MG tablet   Oral   Take 5 mg by mouth daily.           Marland Kitchen lisinopril (PRINIVIL,ZESTRIL) 10 MG tablet   Oral   Take 10 mg by mouth daily.           . raloxifene (EVISTA) 60 MG tablet   Oral   Take 60 mg by mouth daily.            BP 176/97  Pulse 74  Temp(Src) 98.6 F (37 C) (Oral)  Resp 16  SpO2 98% Physical Exam  Nursing note and vitals reviewed. Constitutional: She is oriented to person, place, and time. She appears well-developed and well-nourished. No distress.  Awake, alert, talkative and in no distress.   Eyes: Conjunctivae and EOM are normal.  Neck: Normal range of motion. Neck supple.  Cardiovascular: Intact distal pulses.   Irregularly irregular, tachycardic.  Pulmonary/Chest: Effort normal and breath sounds normal. No respiratory distress. She has no wheezes. She has no rales.  Musculoskeletal: She exhibits no edema.  Neurological: She is alert and oriented to person, place, and time. She exhibits normal muscle tone.  Skin: Skin is dry.  Psychiatric: She has a normal mood and affect.    ED Course  Procedures (including critical care time) Labs Review Labs Reviewed - No data to display Imaging Review No results found. EKG: A-fib with VR 130. ST-T wave abnormality II, aVF, V4-V6  MDM   1. Atrial fibrillation with rapid ventricular response   2. Headache     Transfer to the ED via EMS. Remains stable. Newly diagnosed A-Fib with rapid VR and   St-t wave abnormality.     Janne Napoleon, NP 11/07/13 [phone]

## 2013-11-07 NOTE — ED Notes (Signed)
Diet tray ordered 

## 2013-11-07 NOTE — ED Notes (Signed)
Dr. David at bedside. 

## 2013-11-07 NOTE — H&P (Signed)
PCP:   Irven Shelling, MD   Chief Complaint:  palpitations  HPI: 80 yo female with 2 days of palpitations and feeling her heart racing went to urgent care who sent her to ED for afib w rvr rate was in 140s.  No sob.  No cp.  No swelling.  No fevers, no recent illnessess.  No n/v/d.  Was given cardizem 5 mg iv once and rate less than 110 now.    Review of Systems:  Positive and negative as per HPI otherwise all other systems are negative  Past Medical History: Past Medical History  Diagnosis Date  . Hypertension   . Breast cancer, right, intraductal 01/20/1988   Past Surgical History  Procedure Laterality Date  . Abdominal hysterectomy    . Mastectomy      Medications: Prior to Admission medications   Medication Sig Start Date End Date Taking? Authorizing Provider  acetaminophen (TYLENOL) 500 MG tablet Take 500 mg by mouth every 6 (six) hours as needed for moderate pain.   Yes Historical Provider, MD  amLODipine (NORVASC) 5 MG tablet Take 5 mg by mouth daily.     Yes Historical Provider, MD  [place] 250 MCG tablet Take 250 mcg by mouth daily. 10/05/13  Yes Historical Provider, MD  lisinopril (PRINIVIL,ZESTRIL) 10 MG tablet Take 10 mg by mouth daily.     Yes Historical Provider, MD  metoprolol succinate (TOPROL-XL) 25 MG 24 hr tablet Take 25 mg by mouth every morning.  10/17/13  Yes Historical Provider, MD  Multiple Vitamin (MULTIVITAMIN WITH MINERALS) TABS tablet Take 1 tablet by mouth daily.   Yes Historical Provider, MD  Omega-3 Fatty Acids (FISH OIL) 1000 MG CAPS Take 1,000 mg by mouth daily.   Yes Historical Provider, MD  raloxifene (EVISTA) 60 MG tablet Take 60 mg by mouth daily.     Yes Historical Provider, MD  triamcinolone (NASACORT ALLERGY 24HR) 55 MCG/ACT AERO nasal inhaler Place 2 sprays into the nose daily as needed (for allergies).   Yes Historical Provider, MD    Allergies:  No Known Allergies  Social History:  reports that she has quit smoking. She does not  have any smokeless tobacco history on file. She reports that she does not drink alcohol. Her drug history is not on file.  Family History: none  Physical Exam: Filed Vitals:   11/07/13 1621 11/07/13 1649 11/07/13 1735  BP: 172/76 171/85 163/91  Pulse: 125    Temp: 97.9 F (36.6 C)    TempSrc: Oral    Resp: 25 19 18   SpO2: 100% 99% 99%   General appearance: alert, cooperative and no distress Head: Normocephalic, without obvious abnormality, atraumatic Eyes: negative Nose: Nares normal. Septum midline. Mucosa normal. No drainage or sinus tenderness. Neck: no JVD and supple, symmetrical, trachea midline Lungs: clear to auscultation bilaterally Heart: irregularly irregular rhythm Abdomen: soft, non-tender; bowel sounds normal; no masses,  no organomegaly Extremities: extremities normal, atraumatic, no cyanosis or edema Pulses: 2+ and symmetric Skin: Skin color, texture, turgor normal. No rashes or lesions Neurologic: Grossly normal    Labs on Admission:   Recent Labs  11/07/13 1615 11/07/13 1647  NA 137  --   K 4.3  --   CL 97  --   CO2 26  --   GLUCOSE 128*  --   BUN 25*  --   CREATININE 0.61  --   CALCIUM 9.5  --   MG  --  2.0    Recent Labs  11/07/13 1615  AST 25  ALT 17  ALKPHOS 41  BILITOT 0.4  PROT 7.8  ALBUMIN 3.8    Recent Labs  11/07/13 1615  WBC 7.3  NEUTROABS 5.3  HGB 14.5  HCT 43.0  MCV 91.7  PLT 177    Recent Labs  11/07/13 1647  TROPONINI <0.30    Radiological Exams on Admission: Dg Chest Port 1 View  11/07/2013   CLINICAL DATA:  Hypertension.  History of breast carcinoma.  EXAM: PORTABLE CHEST - 1 VIEW  COMPARISON:  03/11/2010  FINDINGS: Cardiac silhouette is normal in size and configuration. The aorta is mildly uncoiled. No mediastinal or hilar masses.  There are irregularly thickened interstitial markings bilaterally similar to the prior exam allowing for lower lung volumes. No focal consolidation. No pleural effusion or  pneumothorax.  Changes from right breast surgery are stable.  Bony thorax is diffusely demineralized but grossly intact.  IMPRESSION: 1. Diffuse irregularly thickened interstitial markings is essentially stable from the prior study, presumed to be chronic. 2. No acute cardiopulmonary disease.   Electronically Signed   By: Lajean Manes M.D.   On: 11/07/2013 17:24    Assessment/Plan  80 yo female with afib with rvr  Principal Problem:   Atrial fibrillation with RVR-  Will increase her bblocker orally, give her dose now.  Echo in am.  Full dose lovenox, only on aspirin.  Would be good anticoagulation candidate.  Cardiology service was consulted by ED recommended medicine admission, will see in am.  obs on telemetry, further recs and w/u per cardiology service.  Active Problems:   Breast cancer, right, intraductal h/o   Hypertension should be able to tolerate increasing her bblocker   Palpitations      Catalyna Reilly A 11/07/2013, 7:16 PM

## 2013-11-07 NOTE — ED Notes (Signed)
Pt to ED via [facility] from [facility] for further evaluation of rapid a-fib w/ RVR new onset.  Pt states she felt her pulse racing this morning and went to get it checked out, no hx of same.  Pt has no complaints at present.

## 2013-11-07 NOTE — ED Notes (Signed)
Pt  Has  A  Headache  And      Feels  Like  Her   Heart  Is  Beating  Fast        She  Was  Seen  By    Her  pcp  2  Days  Ago       The   Fast  Heart  Beat   Sensation  Started  Yesterday            Pt       Ambulated  To  Room       Drove  Herself  To  Clinic  And        denys  Any  Chest pain or  shortness of  Breath

## 2013-11-08 DIAGNOSIS — I059 Rheumatic mitral valve disease, unspecified: Secondary | ICD-10-CM

## 2013-11-08 MED ORDER — RIVAROXABAN 15 MG PO TABS
15.0000 mg | ORAL_TABLET | Freq: Every day | ORAL | Status: DC
  Administered 2013-11-08 – 2013-11-09 (×2): 15 mg via ORAL
  Filled 2013-11-08 (×3): qty 1

## 2013-11-08 MED ORDER — RIVAROXABAN 15 MG PO TABS
15.0000 mg | ORAL_TABLET | Freq: Every day | ORAL | Status: DC
  Filled 2013-11-08: qty 1

## 2013-11-08 MED ORDER — OFF THE BEAT BOOK
Freq: Once | Status: AC
  Administered 2013-11-08: 22:00:00
  Filled 2013-11-08: qty 1

## 2013-11-08 MED ORDER — METOPROLOL TARTRATE 25 MG PO TABS
25.0000 mg | ORAL_TABLET | Freq: Two times a day (BID) | ORAL | Status: DC
  Administered 2013-11-08 – 2013-11-10 (×5): 25 mg via ORAL
  Filled 2013-11-08 (×6): qty 1

## 2013-11-08 NOTE — ED Provider Notes (Signed)
Medical screening examination/treatment/procedure(s) were performed by a resident physician or non-physician practitioner and as the supervising physician I was immediately available for consultation/collaboration.  Lynne Leader, MD    Gregor Hams, MD 11/08/13 [phone]

## 2013-11-08 NOTE — Discharge Instructions (Signed)

## 2013-11-08 NOTE — Plan of Care (Signed)
Problem: Phase II Progression Outcomes Goal: Anticoagulation Therapy per MD order Outcome: Completed/Met Date Met:  11/08/13 xeralto

## 2013-11-08 NOTE — Progress Notes (Signed)
UR Completed Shanterria Franta Graves-Bigelow, RN,BSN [phone]  

## 2013-11-08 NOTE — Care Management Note (Unsigned)
    Page 1 of 1   11/08/2013     2:05:06 PM   CARE MANAGEMENT NOTE 11/08/2013  Patient:  Tina Wolfe, Tina Wolfe   Account Number:  [id number]  Date Initiated:  11/08/2013  Documentation initiated by:  GRAVES-BIGELOW,Chanler Schreiter  Subjective/Objective Assessment:   Pt admitted for afib. Plan for home on xarelto.     Action/Plan:   Benefits check completed  and co pay will be $40.00. CM will provide pt with 30 day free card. No further needs from CM at this time. Pt will ned Rx for 30 day free/ no refills along with original Rx with refills.   Anticipated DC Date:  11/09/2013   Anticipated DC Plan:  [place]  CM consult  Medication Assistance      Choice offered to / List presented to:             Status of service:  In process, will continue to follow Medicare Important Message given?   (If response is "NO", the following Medicare IM given date fields will be blank) Date Medicare IM given:   Date Additional Medicare IM given:    Discharge Disposition:    Per UR Regulation:  Reviewed for med. necessity/level of care/duration of stay  If discussed at [place] of Stay Meetings, dates discussed:    Comments:

## 2013-11-08 NOTE — Progress Notes (Signed)
  Echocardiogram 2D Echocardiogram has been performed.  Tina Wolfe 11/08/2013, 4:37 PM

## 2013-11-08 NOTE — Progress Notes (Signed)
Subjective: Pt without SOB/ CP  Objective: Vital signs in last 24 hours: Temp:  [97.9 F (36.6 C)-98.6 F (37 C)] 98.4 F (36.9 C) (03/13 0456) Pulse Rate:  [67-125] 67 (03/13 0456) Resp:  [16-25] 18 (03/13 0456) BP: (149-176)/(69-97) 175/83 mmHg (03/13 0456) SpO2:  [98 %-100 %] 98 % (03/13 0456) Weight:  [52.8 kg (116 lb 6.5 oz)] 52.8 kg (116 lb 6.5 oz) (03/12 2039) Weight change:     Intake/Output from previous day:   Intake/Output this shift:    General appearance: alert Resp: clear to auscultation bilaterally Cardio: irregularly irregular rhythm Extremities: extremities normal, atraumatic, no cyanosis or edema  Lab Results:  Recent Labs  11/07/13 1615  WBC 7.3  HGB 14.5  HCT 43.0  PLT 177   BMET  Recent Labs  11/07/13 1615  NA 137  K 4.3  CL 97  CO2 26  GLUCOSE 128*  BUN 25*  CREATININE 0.61  CALCIUM 9.5    Studies/Results: Dg Chest Port 1 View  11/07/2013   CLINICAL DATA:  Hypertension.  History of breast carcinoma.  EXAM: PORTABLE CHEST - 1 VIEW  COMPARISON:  03/11/2010  FINDINGS: Cardiac silhouette is normal in size and configuration. The aorta is mildly uncoiled. No mediastinal or hilar masses.  There are irregularly thickened interstitial markings bilaterally similar to the prior exam allowing for lower lung volumes. No focal consolidation. No pleural effusion or pneumothorax.  Changes from right breast surgery are stable.  Bony thorax is diffusely demineralized but grossly intact.  IMPRESSION: 1. Diffuse irregularly thickened interstitial markings is essentially stable from the prior study, presumed to be chronic. 2. No acute cardiopulmonary disease.   Electronically Signed   By: Lajean Manes M.D.   On: 11/07/2013 17:24    Medications: I have reviewed the patient's current medications.  Assessment/Plan: Afib/ RVR- now rate control-  Increase BB/ on Digoxin-  Echo today Cardiac marker negative Cardiology consult Anticoagulation- xarelto per  pharmacy HTN: continue BP meds Breast CAncer: on tamoxifen PT consult.   LOS: 1 day   Keta [place]valkenburgh 11/08/2013, 7:19 AM

## 2013-11-08 NOTE — Progress Notes (Addendum)
ANTICOAGULATION CONSULT NOTE - Initial Consult  Pharmacy Consult for xareto Indication: non valvular afib  No Known Allergies  Patient Measurements: Height: 5\' 1"  (154.9 cm) Weight: 116 lb 6.5 oz (52.8 kg) IBW/kg (Calculated) : 47.8 Heparin Dosing Weight:   Vital Signs: Temp: 98.4 F (36.9 C) (03/13 0456) Temp src: Oral (03/13 0456) BP: 175/83 mmHg (03/13 0456) Pulse Rate: 67 (03/13 0456)  Labs:  Recent Labs  11/07/13 1615 11/07/13 1647  HGB 14.5  --   HCT 43.0  --   PLT 177  --   LABPROT  --  13.8  INR  --  1.08  CREATININE 0.61  --   TROPONINI  --  <0.30    Estimated Creatinine Clearance: 37.4 ml/min (by C-G formula based on Cr of 0.61).   Medical History: Past Medical History  Diagnosis Date  . Hypertension   . Breast cancer, right, intraductal 01/20/1988    Medications:  Prescriptions prior to admission  Medication Sig Dispense Refill  . acetaminophen (TYLENOL) 500 MG tablet Take 500 mg by mouth every 6 (six) hours as needed for moderate pain.      [place]land Kitchen amLODipine (NORVASC) 5 MG tablet Take 5 mg by mouth daily.        Marland Kitchen DIGOX 250 MCG tablet Take 250 mcg by mouth daily.      Marland Kitchen lisinopril (PRINIVIL,ZESTRIL) 10 MG tablet Take 10 mg by mouth daily.        . metoprolol succinate (TOPROL-XL) 25 MG 24 hr tablet Take 25 mg by mouth every morning.       . Multiple Vitamin (MULTIVITAMIN WITH MINERALS) TABS tablet Take 1 tablet by mouth daily.      . Omega-3 Fatty Acids (FISH OIL) 1000 MG CAPS Take 1,000 mg by mouth daily.      . raloxifene (EVISTA) 60 MG tablet Take 60 mg by mouth daily.        Marland Kitchen triamcinolone (NASACORT ALLERGY 24HR) 55 MCG/ACT AERO nasal inhaler Place 2 sprays into the nose daily as needed (for allergies).        Assessment: 80 yo female. With non-valvular afib. Est crcl= 37.4 ml/min. Last dose of lovenox was 2200 on 3/12. Scheduling first dose of xarelto for recommended time of 1700-allowing time to clear lovenox in 80 yo 52 kg patient with  borderline CrCL.  Goal of Therapy:  Renal adjust rivaroxaban    Plan:  xarelto 15 mg once daily. 1st dose at 1700 today.    Curlene Dolphin 11/08/2013,7:26 AM

## 2013-11-09 NOTE — Progress Notes (Signed)
Subjective: Patient doing well without complaints, no chest pain no shortness of breath  Objective: Vital signs in last 24 hours: Temp:  [98.1 F (36.7 C)] 98.1 F (36.7 C) (03/14 0616) Pulse Rate:  [66-91] 66 (03/14 1101) Resp:  [18] 18 (03/14 0616) BP: (159-171)/(79-81) 159/79 mmHg (03/14 1059) SpO2:  [94 %-96 %] 94 % (03/14 0616) Weight change:  Last BM Date: 11/07/13  Intake/Output from previous day: 03/13 0701 - 03/14 0700 In: 846 [P.O.:840; I.V.:6] Out: -  Intake/Output this shift:    Resp: clear to auscultation bilaterally Cardio: irregularly irregular rhythm Extremities: No clubbing cyanosis, trace pedal edema  Lab Results:  No results found for this or any previous visit (from the past 24 hour(s)).    Studies/Results: Dg Chest Port 1 View  11/07/2013   CLINICAL DATA:  Hypertension.  History of breast carcinoma.  EXAM: PORTABLE CHEST - 1 VIEW  COMPARISON:  03/11/2010  FINDINGS: Cardiac silhouette is normal in size and configuration. The aorta is mildly uncoiled. No mediastinal or hilar masses.  There are irregularly thickened interstitial markings bilaterally similar to the prior exam allowing for lower lung volumes. No focal consolidation. No pleural effusion or pneumothorax.  Changes from right breast surgery are stable.  Bony thorax is diffusely demineralized but grossly intact.  IMPRESSION: 1. Diffuse irregularly thickened interstitial markings is essentially stable from the prior study, presumed to be chronic. 2. No acute cardiopulmonary disease.   Electronically Signed   By: Lajean Manes M.D.   On: 11/07/2013 17:24    Medications:  Prior to Admission:  Prescriptions prior to admission  Medication Sig Dispense Refill  . acetaminophen (TYLENOL) 500 MG tablet Take 500 mg by mouth every 6 (six) hours as needed for moderate pain.      Marland Kitchen amLODipine (NORVASC) 5 MG tablet Take 5 mg by mouth daily.        Marland Kitchen DIGOX 250 MCG tablet Take 250 mcg by mouth daily.      Marland Kitchen  lisinopril (PRINIVIL,ZESTRIL) 10 MG tablet Take 10 mg by mouth daily.        . metoprolol succinate (TOPROL-XL) 25 MG 24 hr tablet Take 25 mg by mouth every morning.       . Multiple Vitamin (MULTIVITAMIN WITH MINERALS) TABS tablet Take 1 tablet by mouth daily.      . Omega-3 Fatty Acids (FISH OIL) 1000 MG CAPS Take 1,000 mg by mouth daily.      . raloxifene (EVISTA) 60 MG tablet Take 60 mg by mouth daily.        Marland Kitchen triamcinolone (NASACORT ALLERGY 24HR) 55 MCG/ACT AERO nasal inhaler Place 2 sprays into the nose daily as needed (for allergies).       Scheduled: . amLODipine  5 mg Oral Daily  . digoxin  250 mcg Oral Daily  . lisinopril  10 mg Oral Daily  . metoprolol tartrate  25 mg Oral BID  . multivitamin with minerals  1 tablet Oral Daily  . raloxifene  60 mg Oral Daily  . rivaroxaban  15 mg Oral Q supper  . sodium chloride  3 mL Intravenous Q12H  . sodium chloride  3 mL Intravenous Q12H   Continuous:  FWY:OVZCHY chloride, acetaminophen, fluticasone, sodium chloride, zolpidem  Assessment/Plan: Principal Problem:   Atrial fibrillation with RVR currently rate controlled, patient on beta blocker, digoxin. Palpation notes reviewed apparently she had paroxysmal atrial tachycardia. At this time no cardiology consult has been obtained, patient appears stable, with controlled rate echo without  LV dysfunction. Await physical therapy recommendation, plans for discharge to home with outpatient followup with primary M.D. in cardiology. Active Problems:   Breast cancer, right, intraductal   Hypertension   Palpitations   A-fib     LOS: 2 days   Smrithi Pigford D 11/09/2013, 1:49 PM

## 2013-11-09 NOTE — Progress Notes (Signed)
Utilization review completed.  P.J. Aleyssa Pike,RN,BSN Case Manager [phone]  

## 2013-11-10 MED ORDER — RIVAROXABAN 15 MG PO TABS: 15.0000 mg | ORAL_TABLET | Freq: Every day | ORAL | Status: AC

## 2013-11-10 MED ORDER — RIVAROXABAN 15 MG PO TABS: 15.0000 mg | ORAL_TABLET | Freq: Every day | ORAL | Status: DC

## 2013-11-10 MED ORDER — METOPROLOL TARTRATE 25 MG PO TABS: 25.0000 mg | ORAL_TABLET | Freq: Two times a day (BID) | ORAL | Status: DC

## 2013-11-10 NOTE — Discharge Summary (Signed)
Physician Discharge Summary  Patient ID: Tina Wolfe MRN: [id number] DOB/AGE: [birth year] 78 y.o.  Admit date: 11/07/2013 Discharge date: 11/10/2013  Admission Diagnoses:  Discharge Diagnoses:  Principal Problem:   Atrial fibrillation with RVR Active Problems:   Breast cancer, right, intraductal   Hypertension   Palpitations   A-fib   Discharged Condition: stable  Hospital Course:  Patient presented to the hospital with complaint of palpitations, initially presented to the urgent care, found to be in atrial fibrillation. In most, she was found to have A. fib with RVR, admission was seen necessary for further evaluation and treatment. Please see dictated H&P for further details. Patient was given IV diltiazem x1 and had significant improvement in her heart rate. Her beta blocker was increased. Electrolytes were unremarkable, TSH unremarkable, cardiac enzymes negative. Patient had a 2-D echo which revealed grade 1 diastolic dysfunction no wall motion abnormality. Patient was seen by physical therapy, she ambulated without any difficulty, and was comment in the chart cardiology consultation however she was not seen by cardiology. Currently she is stable, she's tolerating anticoagulant, rate is controlled, patient to have further outpatient management and followup by cardiology. Consults:    Significant Diagnostic Studies:Dg Chest Port 1 View  11/07/2013   CLINICAL DATA:  Hypertension.  History of breast carcinoma.  EXAM: PORTABLE CHEST - 1 VIEW  COMPARISON:  03/11/2010  FINDINGS: Cardiac silhouette is normal in size and configuration. The aorta is mildly uncoiled. No mediastinal or hilar masses.  There are irregularly thickened interstitial markings bilaterally similar to the prior exam allowing for lower lung volumes. No focal consolidation. No pleural effusion or pneumothorax.  Changes from right breast surgery are stable.  Bony thorax is diffusely demineralized but grossly intact.  IMPRESSION:  1. Diffuse irregularly thickened interstitial markings is essentially stable from the prior study, presumed to be chronic. 2. No acute cardiopulmonary disease.   Electronically Signed   By: Lajean Manes M.D.   On: 11/07/2013 17:24      Discharge Exam: Blood pressure 149/69, pulse 76, temperature 98.4 F (36.9 C), temperature source Oral, resp. rate 16, height 5\' 1"  (1.549 m), weight 52.8 kg (116 lb 6.5 oz), SpO2 97.00%. General appearance: alert and cooperative Resp: clear to auscultation bilaterally Cardio: irregularly irregular rhythm Extremities: Trace pedal edema  Disposition: 01-Home or Self Care     Medication List    STOP taking these medications       metoprolol succinate 25 MG 24 hr tablet  Commonly known as:  TOPROL-XL      TAKE these medications       acetaminophen 500 MG tablet  Commonly known as:  TYLENOL  Take 500 mg by mouth every 6 (six) hours as needed for moderate pain.     amLODipine 5 MG tablet  Commonly known as:  NORVASC  Take 5 mg by mouth daily.     DIGOX 0.25 MG tablet  Generic drug:  digoxin  Take 250 mcg by mouth daily.     Fish Oil 1000 MG Caps  Take 1,000 mg by mouth daily.     lisinopril 10 MG tablet  Commonly known as:  PRINIVIL,ZESTRIL  Take 10 mg by mouth daily.     metoprolol tartrate 25 MG tablet  Commonly known as:  LOPRESSOR  Take 1 tablet (25 mg total) by mouth 2 (two) times daily.     multivitamin with minerals Tabs tablet  Take 1 tablet by mouth daily.     NASACORT ALLERGY 24HR 55  MCG/ACT Aero nasal inhaler  Generic drug:  triamcinolone  Place 2 sprays into the nose daily as needed (for allergies).     raloxifene 60 MG tablet  Commonly known as:  EVISTA  Take 60 mg by mouth daily.     Rivaroxaban 15 MG Tabs tablet  Commonly known as:  XARELTO  Take 1 tablet (15 mg total) by mouth daily with supper.           Follow-up Information   Follow up with Irven Shelling, MD In 1 week.   Specialty:  Internal  Medicine   Contact information:   [address] [place] [address] [phone]       Signed: Kandice Hams 11/10/2013, 11:33 AM

## 2013-11-10 NOTE — Progress Notes (Signed)
Utilization Review Completed.   Robley Matassa, RN, BSN Nurse Case Manager  

## 2013-11-10 NOTE — Evaluation (Signed)
Physical Therapy Evaluation Patient Details Name: Tina Wolfe MRN: [id number] DOB: Jul 29, 1924 Today's Date: 11/10/2013 Time: 0920-0933 PT Time Calculation (min): 13 min  PT Assessment / Plan / Recommendation History of Present Illness  80 yo female with 2 days of palpitations and feeling her heart racing went to urgent care who sent her to ED for afib w rvr rate was in 140s.  No sob.  No cp.  No swelling.  No fevers, no recent illnessess.  No n/v/d.  Was given cardizem 5 mg iv once and rate less than 110 now  Clinical Impression  Pt appears at baseline level of functioning, no further PT needs identified at this time.    PT Assessment  Patent does not need any further PT services    Follow Up Recommendations  No PT follow up    Does the patient have the potential to tolerate intense rehabilitation      Barriers to Discharge        Equipment Recommendations  None recommended by PT    Recommendations for Other Services     Frequency      Precautions / Restrictions Restrictions Weight Bearing Restrictions: No   Pertinent Vitals/Pain Pt c/o L hip soreness with gait, eases with rest, no c/o pain      Mobility  Transfers Overall transfer level: Independent Ambulation/Gait Ambulation/Gait assistance: Independent Ambulation Distance (Feet): 150 Feet Assistive device: None General Gait Details: Pt c/o L hip soreness with gait, states this has been occuring for some time, she uses heat which helps occasionally.  Antalgic gait due to hip soreness, no LOB Stairs: Yes Stairs assistance: Independent Stair Management: One rail Right Number of Stairs: 2    Exercises     PT Diagnosis:    PT Problem List:   PT Treatment Interventions:       PT Goals(Current goals can be found in the care plan section) Acute Rehab PT Goals PT Goal Formulation: No goals set, d/c therapy  Visit Information  Last PT Received On: 11/10/13 Assistance Needed: +1 History of Present Illness: 80  yo female with 2 days of palpitations and feeling her heart racing went to urgent care who sent her to ED for afib w rvr rate was in 140s.  No sob.  No cp.  No swelling.  No fevers, no recent illnessess.  No n/v/d.  Was given cardizem 5 mg iv once and rate less than 110 now       Prior [place] expects to be discharged to:: Private residence Living Arrangements: Alone Available Help at Discharge: Family;Available PRN/intermittently Type of Home: House Home Access: Stairs to enter [employer] of Steps: 2 Entrance Stairs-Rails: None Home Layout: One level Prior Function Level of Independence: Independent Communication Communication: No difficulties    Cognition  Cognition Arousal/Alertness: Awake/alert Behavior During Therapy: WFL for tasks assessed/performed Overall Cognitive Status: Within Functional Limits for tasks assessed    Extremity/Trunk Assessment Lower Extremity Assessment Lower Extremity Assessment: Generalized weakness Cervical / Trunk Assessment Cervical / Trunk Assessment: Kyphotic   Balance    End of Session PT - End of Session Activity Tolerance: Patient tolerated treatment well Patient left: in bed;with call bell/phone within reach Nurse Communication: Mobility status  GP Functional Assessment Tool Used: clinical judgement Functional Limitation: Mobility: Walking and moving around Mobility: Walking and Moving Around Current Status ([id number]): At least 1 percent but less than 20 percent impaired, limited or restricted Mobility: Walking and Moving Around Goal Status [phone]):  At least 1 percent but less than 20 percent impaired, limited or restricted Mobility: Walking and Moving Around Discharge Status [phone]): At least 1 percent but less than 20 percent impaired, limited or restricted   [facility] 11/10/2013, 10:32 AM

## 2013-12-02 ENCOUNTER — Other Ambulatory Visit: Payer: Self-pay

## 2013-12-02 DIAGNOSIS — Z1231 Encounter for screening mammogram for malignant neoplasm of breast: Secondary | ICD-10-CM

## 2013-12-02 DIAGNOSIS — Z9011 Acquired absence of right breast and nipple: Secondary | ICD-10-CM

## 2013-12-25 ENCOUNTER — Ambulatory Visit: Payer: Medicare PPO

## 2013-12-25 ENCOUNTER — Ambulatory Visit
Admission: RE | Admit: 2013-12-25 | Discharge: 2013-12-25 | Disposition: A | Discharge status: Home / Self Care | Payer: Medicare PPO | Source: Ambulatory Visit

## 2013-12-25 ENCOUNTER — Encounter (INDEPENDENT_AMBULATORY_CARE_PROVIDER_SITE_OTHER): Payer: Self-pay

## 2013-12-25 DIAGNOSIS — Z1231 Encounter for screening mammogram for malignant neoplasm of breast: Secondary | ICD-10-CM

## 2013-12-25 DIAGNOSIS — Z9011 Acquired absence of right breast and nipple: Secondary | ICD-10-CM

## 2014-10-30 ENCOUNTER — Encounter (HOSPITAL_COMMUNITY): Payer: Self-pay

## 2014-10-30 ENCOUNTER — Emergency Department (HOSPITAL_COMMUNITY)
Admission: EM | Admit: 2014-10-30 | Discharge: 2014-10-30 | Disposition: A | Discharge status: Home / Self Care | Payer: Medicare PPO | Attending: Emergency Medicine | Admitting: Emergency Medicine

## 2014-10-30 ENCOUNTER — Emergency Department (HOSPITAL_COMMUNITY): Payer: Medicare PPO

## 2014-10-30 DIAGNOSIS — Z853 Personal history of malignant neoplasm of breast: Secondary | ICD-10-CM | POA: Insufficient documentation

## 2014-10-30 DIAGNOSIS — Z87891 Personal history of nicotine dependence: Secondary | ICD-10-CM | POA: Diagnosis not present

## 2014-10-30 DIAGNOSIS — Z79899 Other long term (current) drug therapy: Secondary | ICD-10-CM | POA: Insufficient documentation

## 2014-10-30 DIAGNOSIS — I10 Essential (primary) hypertension: Secondary | ICD-10-CM | POA: Diagnosis not present

## 2014-10-30 DIAGNOSIS — I480 Paroxysmal atrial fibrillation: Secondary | ICD-10-CM | POA: Diagnosis not present

## 2014-10-30 DIAGNOSIS — R42 Dizziness and giddiness: Secondary | ICD-10-CM | POA: Diagnosis present

## 2014-10-30 DIAGNOSIS — Z7901 Long term (current) use of anticoagulants: Secondary | ICD-10-CM | POA: Insufficient documentation

## 2014-10-30 LAB — COMPREHENSIVE METABOLIC PANEL
ALK PHOS: 22 U/L — AB (ref 39–117)
ALT: 16 U/L (ref 0–35)
AST: 27 U/L (ref 0–37)
Albumin: 3.4 g/dL — ABNORMAL LOW (ref 3.5–5.2)
Anion gap: 3 — ABNORMAL LOW (ref 5–15)
BILIRUBIN TOTAL: 0.5 mg/dL (ref 0.3–1.2)
BUN: 21 mg/dL (ref 6–23)
CO2: 32 mmol/L (ref 19–32)
Calcium: 8.9 mg/dL (ref 8.4–10.5)
Chloride: 100 mmol/L (ref 96–112)
Creatinine, Ser: 0.91 mg/dL (ref 0.50–1.10)
GFR calc Af Amer: 63 mL/min — ABNORMAL LOW (ref >90)
GFR calc non Af Amer: 55 mL/min — ABNORMAL LOW (ref >90)
GLUCOSE: 115 mg/dL — AB (ref 70–99)
POTASSIUM: 4 mmol/L (ref 3.5–5.1)
SODIUM: 135 mmol/L (ref 135–145)
Total Protein: 6.3 g/dL (ref 6.0–8.3)

## 2014-10-30 LAB — PROTIME-INR
INR: 1.49 (ref 0.00–1.49)
Prothrombin Time: 18.2 seconds — ABNORMAL HIGH (ref 11.6–15.2)

## 2014-10-30 LAB — CBC
HCT: 38.3 % (ref 36.0–46.0)
HEMOGLOBIN: 13 g/dL (ref 12.0–15.0)
MCH: 31.2 pg (ref 26.0–34.0)
MCHC: 33.9 g/dL (ref 30.0–36.0)
MCV: 91.8 fL (ref 78.0–100.0)
PLATELETS: 155 10*3/uL (ref 150–400)
RBC: 4.17 MIL/uL (ref 3.87–5.11)
RDW: 13.6 % (ref 11.5–15.5)
WBC: 6.9 10*3/uL (ref 4.0–10.5)

## 2014-10-30 LAB — APTT: aPTT: 30 seconds (ref 24–37)

## 2014-10-30 LAB — I-STAT TROPONIN, ED: Troponin i, poc: 0 ng/mL (ref 0.00–0.08)

## 2014-10-30 MED ORDER — SODIUM CHLORIDE 0.9 % IV SOLN
1000.0000 mL | INTRAVENOUS | Status: DC
  Administered 2014-10-30: 1000 mL via INTRAVENOUS

## 2014-10-30 NOTE — ED Notes (Signed)
Per EMS, patient sent to ED by [facility] physicians due to lightheadedness with change in position. Pt. Found in afib RVR with rate 140-160. Hx of afib. On ED arrival patient rate 80-110. Denies CP and SOB.

## 2014-10-30 NOTE — ED Notes (Signed)
X RAY at bedside 

## 2014-10-30 NOTE — ED Provider Notes (Signed)
CSN: [id number]     Arrival date & time 10/30/14  1303 History   First MD Initiated Contact with Patient 10/30/14 1304     Chief Complaint  Patient presents with  . Atrial Fibrillation  . Dizziness   HPI Patient presents to the emergency room for evaluation of atrial fibrillation. Patient states she noticed that she was feeling dizzy and lightheaded starting on Friday. She attributed to some issues with her ear. She thought that the brisk wind that day was causing her to have some vertigo issues. Patient actually went to her doctor's office today. She was complaining of some lightheadedness as well. During the evaluation they noted that she was in atrial fibrillation with a rapid ventricular rate. The rate was in the 140s to 160s. EMS was called. During the transportation after the IV was placed the patient spontaneously converted back into a sinus rhythm. Patient denies any trouble with any chest pain or shortness of breath. She denies any difficulties with her balance or coordination. She denies any nausea vomiting diarrhea fevers or chills. Past Medical History  Diagnosis Date  . Hypertension   . Breast cancer, right, intraductal 01/20/1988   Past Surgical History  Procedure Laterality Date  . Abdominal hysterectomy    . Mastectomy     No family history on file. History  Substance Use Topics  . Smoking status: Former Research scientist (life sciences)  . Smokeless tobacco: Not on file  . Alcohol Use: No   OB History    No data available     Review of Systems  All other systems reviewed and are negative.     Allergies  Review of patient's allergies indicates no known allergies.  Home Medications   Prior to Admission medications   Medication Sig Start Date End Date Taking? Authorizing Provider  acetaminophen (TYLENOL) 500 MG tablet Take 500 mg by mouth every 6 (six) hours as needed for moderate pain.   Yes Historical Provider, MD  amLODipine (NORVASC) 5 MG tablet Take 5 mg by mouth daily.     Yes  Historical Provider, MD  [place] 250 MCG tablet Take 250 mcg by mouth daily. 10/05/13  Yes Historical Provider, MD  lisinopril (PRINIVIL,ZESTRIL) 10 MG tablet Take 10 mg by mouth daily.     Yes Historical Provider, MD  metoprolol succinate (TOPROL-XL) 25 MG 24 hr tablet Take 25 mg by mouth daily.   Yes Historical Provider, MD  Multiple Vitamin (MULTIVITAMIN WITH MINERALS) TABS tablet Take 1 tablet by mouth daily.   Yes Historical Provider, MD  Omega-3 Fatty Acids (FISH OIL) 1000 MG CAPS Take 1,000 mg by mouth daily.   Yes Historical Provider, MD  raloxifene (EVISTA) 60 MG tablet Take 60 mg by mouth daily.     Yes Historical Provider, MD  Rivaroxaban (XARELTO) 15 MG TABS tablet Take 1 tablet (15 mg total) by mouth daily with supper. 11/10/13  Yes Kandice Hams, MD  triamcinolone (NASACORT ALLERGY 24HR) 55 MCG/ACT AERO nasal inhaler Place 2 sprays into the nose daily as needed (for allergies).   Yes Historical Provider, MD  metoprolol tartrate (LOPRESSOR) 25 MG tablet Take 1 tablet (25 mg total) by mouth 2 (two) times daily. Patient not taking: Reported on 10/30/2014 11/10/13   Kandice Hams, MD   BP 162/64 mmHg  Pulse 100  Temp(Src) 97.8 F (36.6 C) (Oral)  Resp 23  SpO2 95% Physical Exam  Constitutional: She appears well-developed and well-nourished. No distress.  HENT:  Head: Normocephalic and atraumatic.  Right  Ear: External ear normal.  Left Ear: External ear normal.  Eyes: Conjunctivae are normal. Right eye exhibits no discharge. Left eye exhibits no discharge. No scleral icterus.  Neck: Neck supple. No tracheal deviation present.  Cardiovascular: Normal rate, regular rhythm and intact distal pulses.   Pulmonary/Chest: Effort normal and breath sounds normal. No stridor. No respiratory distress. She has no wheezes. She has no rales.  Abdominal: Soft. Bowel sounds are normal. She exhibits no distension. There is no tenderness. There is no rebound and no guarding.  Musculoskeletal: She  exhibits no edema or tenderness.  Neurological: She is alert. She has normal strength. No cranial nerve deficit (no facial droop, extraocular movements intact, no slurred speech) or sensory deficit. She exhibits normal muscle tone. She displays no seizure activity. Coordination normal.  Skin: Skin is warm and dry. No rash noted. She is not diaphoretic.  Psychiatric: She has a normal mood and affect.  Nursing note and vitals reviewed.   ED Course  Procedures (including critical care time) Labs Review Labs Reviewed  COMPREHENSIVE METABOLIC PANEL - Abnormal; Notable for the following:    Glucose, Bld 115 (*)    Albumin 3.4 (*)    Alkaline Phosphatase 22 (*)    GFR calc non Af Amer 55 (*)    GFR calc Af Amer 63 (*)    Anion gap 3 (*)    All other components within normal limits  PROTIME-INR - Abnormal; Notable for the following:    Prothrombin Time 18.2 (*)    All other components within normal limits  APTT  CBC  I-STAT TROPOININ, ED    Imaging Review Dg Chest Portable 1 View  10/30/2014   CLINICAL DATA:  Atrial fibrillation.  Dizziness.  EXAM: PORTABLE CHEST - 1 VIEW  COMPARISON:  11/07/2013.  FINDINGS: Cardiopericardial silhouette is within normal limits. Aortic arch atherosclerosis. RIGHT axillary dissection clips are present. Monitoring leads project over the chest. Chronic elevation of the RIGHT hemidiaphragm. Pulmonary parenchymal scarring. No airspace disease. No pleural effusion. Chronic bilateral pleural apical thickening, also likely representing scarring. RIGHT basilar opacity is most compatible with atelectasis.  IMPRESSION: No acute cardiopulmonary disease.  Chronic changes of the chest.   Electronically Signed   By: Dereck Ligas M.D.   On: 10/30/2014 14:48     EKG Interpretation   Date/Time:  Thursday October 30 2014 13:17:33 EST Ventricular Rate:  97 PR Interval:  152 QRS Duration: 83 QT Interval:  339 QTC Calculation: 431 R Axis:   37 Text Interpretation:  Sinus  tachycardia Multiple premature complexes, vent   Borderline ST depression, diffuse leads No significant change since last  tracing Confirmed by Luiza Carranco  MD-J, Yonah Tangeman ([id number]) on 10/30/2014 1:25:26 PM      MDM   Final diagnoses:  Paroxysmal atrial fibrillation   Patient had an episode of paroxysmal atrial fibrillation.  She converted back to a sinus rhythm are to arrival. She does continue to have premature atrial complexes but is asymptomatic. Patient is already on anticoagulants.  Pt feels well.    At this time there does not appear to be any evidence of an acute emergency medical condition and the patient appears stable for discharge with appropriate outpatient follow up.     Dorie Rank, MD 10/30/14 [phone]

## 2014-10-30 NOTE — Discharge Instructions (Signed)

## 2014-10-30 NOTE — ED Notes (Signed)
Pt is stable upon d/c and is escorted by staff from ED via wheelchair.

## 2014-10-30 NOTE — ED Notes (Signed)
Pt. Reports dizziness with movement since Friday, states "I was out in the wind and it affected my ear".

## 2015-01-07 ENCOUNTER — Emergency Department (INDEPENDENT_AMBULATORY_CARE_PROVIDER_SITE_OTHER)
Admission: EM | Admit: 2015-01-07 | Discharge: 2015-01-07 | Disposition: A | Discharge status: Home / Self Care | Payer: Medicare PPO | Source: Home / Self Care | Attending: Emergency Medicine | Admitting: Emergency Medicine

## 2015-01-07 ENCOUNTER — Encounter (HOSPITAL_COMMUNITY): Payer: Self-pay | Admitting: Emergency Medicine

## 2015-01-07 ENCOUNTER — Emergency Department (HOSPITAL_COMMUNITY)
Admission: EM | Admit: 2015-01-07 | Discharge: 2015-01-07 | Disposition: A | Discharge status: Home / Self Care | Payer: Medicare PPO | Source: Home / Self Care

## 2015-01-07 DIAGNOSIS — H6981 Other specified disorders of Eustachian tube, right ear: Secondary | ICD-10-CM | POA: Diagnosis not present

## 2015-01-07 MED ORDER — HYDROCORTISONE-ACETIC ACID 1-2 % OT SOLN: 3.0000 [drp] | Freq: Three times a day (TID) | OTIC | Status: DC

## 2015-01-07 MED ORDER — TETANUS-DIPHTH-ACELL PERTUSSIS 5-2.5-18.5 LF-MCG/0.5 IM SUSP
INTRAMUSCULAR | Status: AC
  Filled 2015-01-07: qty 0.5

## 2015-01-07 NOTE — ED Provider Notes (Signed)
CSN: [id number]     Arrival date & time 01/07/15  1547 History   First MD Initiated Contact with Patient 01/07/15 1702     Chief Complaint  Patient presents with  . Ear Fullness   (Consider location/radiation/quality/duration/timing/severity/associated sxs/prior Treatment) HPI Comments: No hearing loss. No drainage. No URI sx. No dental issues. No pain. No fever  Patient is a 81 y.o. female presenting with plugged ear sensation. The history is provided by the patient.  Ear Fullness This is a chronic problem. Episode onset: on right, sx began one month ago.  The problem occurs constantly. The problem has not changed since onset.Associated symptoms comments: none.    Past Medical History  Diagnosis Date  . Hypertension   . Breast cancer, right, intraductal 01/20/1988   Past Surgical History  Procedure Laterality Date  . Abdominal hysterectomy    . Mastectomy     No family history on file. History  Substance Use Topics  . Smoking status: Former Research scientist (life sciences)  . Smokeless tobacco: Not on file  . Alcohol Use: No   OB History    No data available     Review of Systems  All other systems reviewed and are negative.   Allergies  Review of patient's allergies indicates no known allergies.  Home Medications   Prior to Admission medications   Medication Sig Start Date End Date Taking? Authorizing Provider  amLODipine (NORVASC) 5 MG tablet Take 5 mg by mouth daily.     Yes Historical Provider, MD  [place] 250 MCG tablet Take 250 mcg by mouth daily. 10/05/13  Yes Historical Provider, MD  lisinopril (PRINIVIL,ZESTRIL) 10 MG tablet Take 10 mg by mouth daily.     Yes Historical Provider, MD  Multiple Vitamin (MULTIVITAMIN WITH MINERALS) TABS tablet Take 1 tablet by mouth daily.   Yes Historical Provider, MD  acetaminophen (TYLENOL) 500 MG tablet Take 500 mg by mouth every 6 (six) hours as needed for moderate pain.    Historical Provider, MD  acetic acid-hydrocortisone (VOSOL-HC) otic solution  Place 3 drops into the right ear 3 (three) times daily. X 5 days 01/07/15   Audelia Hives Orbie Grupe, PA  metoprolol succinate (TOPROL-XL) 25 MG 24 hr tablet Take 25 mg by mouth daily.    Historical Provider, MD  metoprolol tartrate (LOPRESSOR) 25 MG tablet Take 1 tablet (25 mg total) by mouth 2 (two) times daily. Patient not taking: Reported on 10/30/2014 11/10/13   Seward Carol, MD  Omega-3 Fatty Acids (FISH OIL) 1000 MG CAPS Take 1,000 mg by mouth daily.    Historical Provider, MD  raloxifene (EVISTA) 60 MG tablet Take 60 mg by mouth daily.      Historical Provider, MD  Rivaroxaban (XARELTO) 15 MG TABS tablet Take 1 tablet (15 mg total) by mouth daily with supper. 11/10/13   Seward Carol, MD  triamcinolone (NASACORT ALLERGY 24HR) 55 MCG/ACT AERO nasal inhaler Place 2 sprays into the nose daily as needed (for allergies).    Historical Provider, MD   BP 210/100 mmHg  Pulse 112  Temp(Src) 98.1 F (36.7 C) (Oral)  Resp 20  SpO2 98% Physical Exam  Constitutional: She is oriented to person, place, and time. She appears well-developed and well-nourished. No distress.  HENT:  Head: Normocephalic and atraumatic.  Right Ear: Hearing, external ear and ear canal normal. No drainage, swelling or tenderness. No mastoid tenderness. Tympanic membrane is retracted. Tympanic membrane is not injected, not perforated and not erythematous. No middle ear effusion.  Left  Ear: Hearing, tympanic membrane, external ear and ear canal normal.  Eyes: Conjunctivae are normal.  Neck: Neck supple.  Pulmonary/Chest: Effort normal.  Musculoskeletal: Normal range of motion.  Lymphadenopathy:    She has no cervical adenopathy.  Neurological: She is alert and oriented to person, place, and time.  Skin: Skin is warm and dry. No rash noted. No erythema.  Psychiatric: She has a normal mood and affect. Her behavior is normal.  Nursing note and vitals reviewed.   ED Course  Procedures (including critical care time) Labs  Review Labs Reviewed - No data to display  Imaging Review No results found.   MDM   1. Eustachian tube dysfunction, right   Patient advised regarding elevated blood pressure and she states "it always runs high." I strongly encouraged her to discuss this with her PCP as soon as possible. Vosol-HC as directed for sense of right ear fullness and advised to follow up PCP if symptoms persist.     Lutricia Feil, PA 01/07/15 [phone]

## 2015-01-07 NOTE — ED Notes (Signed)
C/o right ear fullness onset 1 month w/decreased hearing BP today = 210/100 Denies SOB, CP, HA, weakness Alert, no signs of acute distress.

## 2015-01-30 ENCOUNTER — Encounter (HOSPITAL_COMMUNITY): Payer: Self-pay | Admitting: Emergency Medicine

## 2015-01-30 ENCOUNTER — Emergency Department (HOSPITAL_COMMUNITY)
Admission: EM | Admit: 2015-01-30 | Discharge: 2015-01-30 | Disposition: A | Discharge status: Home / Self Care | Payer: Medicare PPO | Source: Home / Self Care | Attending: Internal Medicine | Admitting: Internal Medicine

## 2015-01-30 DIAGNOSIS — F410 Panic disorder [episodic paroxysmal anxiety] without agoraphobia: Secondary | ICD-10-CM | POA: Diagnosis not present

## 2015-01-30 DIAGNOSIS — F5105 Insomnia due to other mental disorder: Secondary | ICD-10-CM

## 2015-01-30 DIAGNOSIS — F419 Anxiety disorder, unspecified: Secondary | ICD-10-CM

## 2015-01-30 MED ORDER — METOPROLOL SUCCINATE ER 50 MG PO TB24: 50.0000 mg | ORAL_TABLET | Freq: Every day | ORAL | Status: AC

## 2015-01-30 MED ORDER — LISINOPRIL 20 MG PO TABS: 20.0000 mg | ORAL_TABLET | Freq: Every day | ORAL | Status: AC

## 2015-01-30 MED ORDER — ALPRAZOLAM 0.25 MG PO TABS: 0.2500 mg | ORAL_TABLET | Freq: Every evening | ORAL | Status: DC | PRN

## 2015-01-30 MED ORDER — DIGOXIN 250 MCG PO TABS: 250.0000 ug | ORAL_TABLET | Freq: Every day | ORAL | Status: AC

## 2015-01-30 NOTE — ED Provider Notes (Signed)
CSN: [id number]     Arrival date & time 01/30/15  0806 History   First MD Initiated Contact with Patient 01/30/15 [phone]     Chief Complaint  Patient presents with  . Anxiety   HPI  Pt states that she feels very anxious and shaky and has felt this way for a month. Intermittent "attacks" of anxiety, sometimes with nausea, sometimes with feelings of impending doom. She has had lorazepam in the past to use when necessary. She is not sleeping well. Denies crying, doesn't feel sad. Denies new stressors in her life. She is not drinking energy drinks, and drinks only a half cup of decaf coffee in the morning. Is not taking any allergy medications are Sudafed products. She says her PCP checked her thyroid recently, and these were normal. She denies palpitations, no dyspnea, no change in leg swelling. No change in chronic constipation. Stable headaches. Stable roaring in the ears.   Past Medical History  Diagnosis Date  . Hypertension   . Breast cancer, right, intraductal 01/20/1988   Past Surgical History  Procedure Laterality Date  . Abdominal hysterectomy    . Mastectomy     History reviewed. No pertinent family history. History  Substance Use Topics  . Smoking status: Former Research scientist (life sciences)  . Smokeless tobacco: Not on file  . Alcohol Use: No   OB History    No data available     Review of Systems  All other systems reviewed and are negative.   Allergies  Review of patient's allergies indicates no known allergies.  Home Medications   Prior to Admission medications   Medication Sig Start Date End Date Taking? Authorizing Provider  acetaminophen (TYLENOL) 500 MG tablet Take 500 mg by mouth every 6 (six) hours as needed for moderate pain.    Historical Provider, MD  acetic acid-hydrocortisone (VOSOL-HC) otic solution Place 3 drops into the right ear 3 (three) times daily. X 5 days 01/07/15   Audelia Hives Presson, PA  amLODipine (NORVASC) 5 MG tablet Take 5 mg by mouth daily.      Historical  Provider, MD  [place] 250 MCG tablet Take 250 mcg by mouth daily. 10/05/13   Historical Provider, MD  lisinopril (PRINIVIL,ZESTRIL) 10 MG tablet Take 10 mg by mouth daily.      Historical Provider, MD  metoprolol succinate (TOPROL-XL) 25 MG 24 hr tablet Take 25 mg by mouth daily.    Historical Provider, MD  metoprolol tartrate (LOPRESSOR) 25 MG tablet Take 1 tablet (25 mg total) by mouth 2 (two) times daily. Patient not taking: Reported on 10/30/2014 11/10/13   Seward Carol, MD  Multiple Vitamin (MULTIVITAMIN WITH MINERALS) TABS tablet Take 1 tablet by mouth daily.    Historical Provider, MD  Omega-3 Fatty Acids (FISH OIL) 1000 MG CAPS Take 1,000 mg by mouth daily.    Historical Provider, MD  raloxifene (EVISTA) 60 MG tablet Take 60 mg by mouth daily.      Historical Provider, MD  Rivaroxaban (XARELTO) 15 MG TABS tablet Take 1 tablet (15 mg total) by mouth daily with supper. 11/10/13   Seward Carol, MD  triamcinolone (NASACORT ALLERGY 24HR) 55 MCG/ACT AERO nasal inhaler Place 2 sprays into the nose daily as needed (for allergies).    Historical Provider, MD   BP 158/80 mmHg  Pulse 101  Temp(Src) 98.1 F (36.7 C) (Oral)  Resp 16  SpO2 100% Physical Exam  Constitutional: She is oriented to person, place, and time. No distress.  Alert, nicely  groomed  HENT:  Head: Atraumatic.  Eyes:  Conjugate gaze, no eye redness/drainage  Neck: Neck supple.  Cardiovascular:  No murmur heard. Irregular rhythm. Heart rate 100  Pulmonary/Chest: No respiratory distress. She has no wheezes. She has no rales.  Lungs clear, symmetric breath sounds  Abdominal: She exhibits no distension.  Musculoskeletal: Normal range of motion.  2+ bilateral lower extremity edema leg, stable per patient Stasis dermatitis changes (hyperpigmentation) bilaterally  Neurological: She is alert and oriented to person, place, and time.  She climbs on and off exam table independently, without difficulty.  Skin: Skin is warm and dry.   No cyanosis  Nursing note and vitals reviewed.   ED Course  Procedures (including critical care time) Labs Review Labs Reviewed - No data to display  Imaging Review No results found.   MDM   1. Anxiety attack   2. Insomnia secondary to anxiety    Patient has some lorazepam tablets at home, she thinks about 10, that she can use for emergency anxiety situations. Her heart rate at 2 urgent care visits in the last month has been greater than 100, and her anxiety symptoms may benefit from an increase in Toprol. She is currently taking 25 mg daily, and I have increased this 50 mg daily temporarily. She will recheck at the urgent care or follow up with her PCP Dr. Laurann Montana in a week or 2, to see if this change has been beneficial. The benefits of daily exercise (walking) were discussed, but the patient is inhibited by fear of falling.    Sherlene Shams, MD 01/30/15 [phone]

## 2015-01-30 NOTE — Discharge Instructions (Signed)
Increasing your toprol to 50mg  once daily may help with feeling "wound too tight."  Might try using lorazepam (that you already have) for "emergency" anxiety situations, preferably at bedtime. Prescription for 50mg  toprol were sent to the [facility].  Panic Attacks Panic attacks are sudden, short feelings of great fear or discomfort. You may have them for no reason when you are relaxed, when you are uneasy (anxious), or when you are sleeping.  HOME CARE  Take all your medicines as told.  Check with your doctor before starting new medicines.  Keep all doctor visits. GET HELP IF:  You are not able to take your medicines as told.  Your symptoms do not get better.  Your symptoms get worse. GET HELP RIGHT AWAY IF:  Your attacks seem different than your normal attacks.  You have thoughts about hurting yourself or others.  You take panic attack medicine and you have a side effect. MAKE SURE YOU:  Understand these instructions.  Will watch your condition.  Will get help right away if you are not doing well or get worse. Document Released: 09/17/2010 Document Revised: 06/05/2013 Document Reviewed: 03/29/2013 [facility] Patient Information 2015 [place], [place]. This information is not intended to replace advice given to you by your health care provider. Make sure you discuss any questions you have with your health care provider.

## 2015-01-30 NOTE — ED Notes (Signed)
Pt states that she feels very anxious and shaky and has felt this way for a month.

## 2016-12-08 ENCOUNTER — Encounter (HOSPITAL_COMMUNITY): Payer: Self-pay | Admitting: Emergency Medicine

## 2016-12-08 ENCOUNTER — Emergency Department (HOSPITAL_COMMUNITY): Payer: Medicare Other

## 2016-12-08 ENCOUNTER — Emergency Department (HOSPITAL_COMMUNITY)
Admission: EM | Admit: 2016-12-08 | Discharge: 2016-12-08 | Disposition: A | Discharge status: Home / Self Care | Payer: Medicare Other | Attending: Emergency Medicine | Admitting: Emergency Medicine

## 2016-12-08 DIAGNOSIS — Z7901 Long term (current) use of anticoagulants: Secondary | ICD-10-CM | POA: Insufficient documentation

## 2016-12-08 DIAGNOSIS — S3992XA Unspecified injury of lower back, initial encounter: Secondary | ICD-10-CM | POA: Diagnosis present

## 2016-12-08 DIAGNOSIS — Y9289 Other specified places as the place of occurrence of the external cause: Secondary | ICD-10-CM | POA: Diagnosis not present

## 2016-12-08 DIAGNOSIS — M4856XA Collapsed vertebra, not elsewhere classified, lumbar region, initial encounter for fracture: Secondary | ICD-10-CM

## 2016-12-08 DIAGNOSIS — Z853 Personal history of malignant neoplasm of breast: Secondary | ICD-10-CM | POA: Insufficient documentation

## 2016-12-08 DIAGNOSIS — Y9389 Activity, other specified: Secondary | ICD-10-CM | POA: Insufficient documentation

## 2016-12-08 DIAGNOSIS — X509XXA Other and unspecified overexertion or strenuous movements or postures, initial encounter: Secondary | ICD-10-CM | POA: Diagnosis not present

## 2016-12-08 DIAGNOSIS — S32018A Other fracture of first lumbar vertebra, initial encounter for closed fracture: Secondary | ICD-10-CM | POA: Diagnosis not present

## 2016-12-08 DIAGNOSIS — I10 Essential (primary) hypertension: Secondary | ICD-10-CM | POA: Insufficient documentation

## 2016-12-08 DIAGNOSIS — Z79899 Other long term (current) drug therapy: Secondary | ICD-10-CM | POA: Insufficient documentation

## 2016-12-08 DIAGNOSIS — Z87891 Personal history of nicotine dependence: Secondary | ICD-10-CM | POA: Insufficient documentation

## 2016-12-08 DIAGNOSIS — Y999 Unspecified external cause status: Secondary | ICD-10-CM | POA: Insufficient documentation

## 2016-12-08 DIAGNOSIS — M549 Dorsalgia, unspecified: Secondary | ICD-10-CM

## 2016-12-08 HISTORY — DX: Unspecified atrial fibrillation: I48.91

## 2016-12-08 MED ORDER — KETOROLAC TROMETHAMINE 60 MG/2ML IM SOLN
10.0000 mg | Freq: Once | INTRAMUSCULAR | Status: AC
  Administered 2016-12-08: 9.9 mg via INTRAMUSCULAR
  Filled 2016-12-08: qty 2

## 2016-12-08 MED ORDER — ACETAMINOPHEN 500 MG PO TABS
1000.0000 mg | ORAL_TABLET | Freq: Once | ORAL | Status: AC
  Administered 2016-12-08: 1000 mg via ORAL
  Filled 2016-12-08: qty 2

## 2016-12-08 MED ORDER — OXYCODONE HCL 5 MG PO TABS: 2.5000 mg | ORAL_TABLET | ORAL | 0 refills | Status: AC | PRN

## 2016-12-08 MED ORDER — OXYCODONE HCL 5 MG PO TABS
2.5000 mg | ORAL_TABLET | Freq: Once | ORAL | Status: AC
  Administered 2016-12-08: 2.5 mg via ORAL
  Filled 2016-12-08: qty 1

## 2016-12-08 NOTE — Discharge Instructions (Signed)
Take tylenol 1000mg(2 extra strength) four times a day.  ° °Then take the pain medicine if you feel like you need it. Narcotics do not help with the pain, they only make you care about it less.  You can become addicted to this, people may break into your house to steal it.  It will constipate you.  If you drive under the influence of this medicine you can get a DUI.   ° °

## 2016-12-08 NOTE — ED Provider Notes (Signed)
[facility] DEPT Provider Note   CSN: [id number] Arrival date & time: 12/08/16  0741     History   Chief Complaint Chief Complaint  Patient presents with  . Back Pain    HPI Tina Wolfe is a 83 y.o. female.  83 yo F with a chief complaint of right-sided low back pain. This occurred after she was reaching up to the grab something in a cabinet. Worse with movement and palpation. She denies abdominal pain denies fevers denies loss of bowel or bladder. Denies lower extremity numbness or weakness. Denies radiation of the pain. Denies trauma.   The history is provided by the patient.  Back Pain   This is a new problem. The current episode started more than 2 days ago. The problem occurs constantly. The problem has been gradually worsening. Associated with: stretching. The pain is present in the lumbar spine. The quality of the pain is described as stabbing. The pain does not radiate. The pain is at a severity of 9/10. The pain is severe. The symptoms are aggravated by bending and twisting. Pertinent negatives include no chest pain, no fever, no headaches and no dysuria. She has tried nothing for the symptoms. The treatment provided no relief.    Past Medical History:  Diagnosis Date  . A-fib ([place])   . Breast cancer, right, intraductal 01/20/1988  . Hypertension     Patient Active Problem List   Diagnosis Date Noted  . Palpitations 11/07/2013  . Atrial fibrillation with RVR ([place]) 11/07/2013  . A-fib ([place]) 11/07/2013  . Hypertension   . Breast cancer, right, intraductal 01/20/1988    Class: History of    Past Surgical History:  Procedure Laterality Date  . ABDOMINAL HYSTERECTOMY    . MASTECTOMY      OB History    No data available       Home Medications    Prior to Admission medications   Medication Sig Start Date End Date Taking? Authorizing Provider  acetaminophen (TYLENOL) 500 MG tablet Take 500 mg by mouth every 6 (six) hours as needed for moderate pain.     Historical Provider, MD  amLODipine (NORVASC) 5 MG tablet Take 5 mg by mouth daily.      Historical Provider, MD  digoxin (DIGOX) 0.25 MG tablet Take 1 tablet (250 mcg total) by mouth daily. Patient is taking 0.5 tab by mouth daily, so daily dose is 125 mcg. 01/30/15   Sherlene Shams, MD  lisinopril (PRINIVIL,ZESTRIL) 20 MG tablet Take 1 tablet (20 mg total) by mouth daily. 01/30/15   Sherlene Shams, MD  Multiple Vitamin (MULTIVITAMIN WITH MINERALS) TABS tablet Take 1 tablet by mouth daily.    Historical Provider, MD  Omega-3 Fatty Acids (FISH OIL) 1000 MG CAPS Take 1,000 mg by mouth daily.    Historical Provider, MD  oxyCODONE (ROXICODONE) 5 MG immediate release tablet Take 0.5 tablets (2.5 mg total) by mouth every 4 (four) hours as needed for severe pain. 12/08/16   Deno Etienne, DO  raloxifene (EVISTA) 60 MG tablet Take 60 mg by mouth daily.      Historical Provider, MD  Rivaroxaban (XARELTO) 15 MG TABS tablet Take 1 tablet (15 mg total) by mouth daily with supper. 11/10/13   Seward Carol, MD  triamcinolone (NASACORT ALLERGY 24HR) 55 MCG/ACT AERO nasal inhaler Place 2 sprays into the nose daily as needed (for allergies).    Historical Provider, MD    Family History History reviewed. No pertinent family history.  Social History Social History  Substance Use Topics  . Smoking status: Former Research scientist (life sciences)  . Smokeless tobacco: Never Used  . Alcohol use No     Allergies   Patient has no known allergies.   Review of Systems Review of Systems  Constitutional: Negative for chills and fever.  HENT: Negative for congestion and rhinorrhea.   Eyes: Negative for redness and visual disturbance.  Respiratory: Negative for shortness of breath and wheezing.   Cardiovascular: Negative for chest pain and palpitations.  Gastrointestinal: Negative for nausea and vomiting.  Genitourinary: Negative for dysuria and urgency.  Musculoskeletal: Positive for back pain. Negative for arthralgias and myalgias.  Skin:  Negative for pallor and wound.  Neurological: Negative for dizziness and headaches.     Physical Exam Updated Vital Signs BP (!) 143/90   Pulse 100   Temp 98.4 F (36.9 C) (Oral)   Resp 19   Ht 5\' 2"  (1.575 m)   Wt 114 lb (51.7 kg)   SpO2 95%   BMI 20.85 kg/m   Physical Exam  Constitutional: She is oriented to person, place, and time. She appears well-developed and well-nourished. No distress.  HENT:  Head: Normocephalic and atraumatic.  Eyes: EOM are normal. Pupils are equal, round, and reactive to light.  Neck: Normal range of motion. Neck supple.  Cardiovascular: Normal rate and regular rhythm.  Exam reveals no gallop and no friction rub.   No murmur heard. Pulmonary/Chest: Effort normal. She has no wheezes. She has no rales.  Abdominal: Soft. She exhibits no distension and no mass. There is no tenderness. There is no guarding.  Musculoskeletal: She exhibits tenderness (There is mild right SI joint tenderness.). She exhibits no edema.  Pulse motor and sensation is intact to bilateral lower extremities  Neurological: She is alert and oriented to person, place, and time.  Skin: Skin is warm and dry. She is not diaphoretic.  Psychiatric: She has a normal mood and affect. Her behavior is normal.  Nursing note and vitals reviewed.    ED Treatments / Results  Labs (all labs ordered are listed, but only abnormal results are displayed) Labs Reviewed - No data to display  EKG  EKG Interpretation None       Radiology Ct L-spine No Charge  Result Date: 12/08/2016 CLINICAL DATA:  83 year old female with lumbar back pain for 5 days becoming severe. EXAM: CT LUMBAR SPINE WITHOUT CONTRAST TECHNIQUE: Multidetector CT imaging of the lumbar spine was performed without intravenous contrast administration. Multiplanar CT image reconstructions were also generated. COMPARISON:  Lumbar spine radiographs 10/07/2013. CT Abdomen and Pelvis today (reported separately) and 10/15/2008  FINDINGS: Segmentation: Normal is demonstrated on the 2015 radiographs. Alignment: Chronic levoconvex lumbar scoliosis and straightening of lumbar lordosis. Vertebrae: Osteopenia. Chronic T12 through L4 compression fractures. The T12 level it is incompletely visualized on these images. The L3 and L4 levels appears stable since 2010. The L1 level demonstrates further compression and loss of height since 2010, and there is un healed lucency of the anterior superior L1 endplate best demonstrated on sagittal image 42. Nondisplaced fracture of the right L1 pedicle (sagittal image 40). The other L1 posterior elements appear intact. Mild retropulsion of the posterior inferior endplate. Severe L2 vertebral body loss of height, progressed since 2010. Still, it is unclear whether there is any un healed lucency at the L2 vertebral body (questionably on sagittal image 48). The L2 posterior elements appear intact. Chronic retropulsion of the posterosuperior L2 endplate. Moderate to severe L5 compression fracture  is new since 2010 but was present on the 2015 radiographs although there may have been further loss of height since that time. Retropulsion of the posterosuperior L5 endplate. Questionable nondisplaced fracture of the left L5 pedicle on sagittal image 49. Otherwise the L5 posterior elements appear intact. Visible sacrum and SI joints appear intact. Paraspinal and other soft tissues: Calcified aortic atherosclerosis. Abdominal and pelvic visceral details are reported separately today with the CT Abdomen and Pelvis. Posterior paraspinal soft tissues are within normal limits. Disc levels: Multilevel lumbar spinal stenosis is primarily related to compression fracture retropulsion. Moderate spinal stenosis at the L2 superior endplate level. Mild to moderate spinal stenosis at the L3-L4 level. Mild to moderate spinal stenosis at the L4-L5 level. IMPRESSION: 1. Osteopenia and diffuse lumbar compression fractures. Of these the  L1 fracture appears most recent, but L2 and L5 also have progressed since the most recent comparison in 2015. If specific therapy such as vertebroplasty is desired, Lumbar MRI or Nuclear Medicine Whole-body Bone Scan would best determine acuity. 2. Mild L1 bony retropulsion and nondisplaced right L1 pedicle fracture. More pronounced bony retropulsion at the other compression fractures. Possible nondisplaced left L5 pedicle fracture. 3. Up to moderate multifactorial lumbar spinal stenosis at L1-L2, L3-L4, and L4-L5. 4.  CT Abdomen and Pelvis from today reported separately. Electronically Signed   By: Genevie Ann M.D.   On: 12/08/2016 10:33   Ct Renal Stone Study  Result Date: 12/08/2016 CLINICAL DATA:  83 year old female with history of lower back pain for the past 4 days, markedly increasing in severity this morning. EXAM: CT ABDOMEN AND PELVIS WITHOUT CONTRAST TECHNIQUE: Multidetector CT imaging of the abdomen and pelvis was performed following the standard protocol without IV contrast. COMPARISON:  CT the abdomen and pelvis 10/15/2008. FINDINGS: Lower chest: Atherosclerotic calcifications in the left main, left anterior descending and right coronary arteries. Atherosclerosis in the thoracic aorta. Hepatobiliary: No definite cystic or solid hepatic lesions are confidently identified on today's noncontrast CT examination. Small calcified granuloma in the inferior aspect of the right lobe of the liver. Gallbladder is unremarkable in appearance. Pancreas: No definite pancreatic mass or peripancreatic inflammatory changes are noted on today's noncontrast CT examination. Spleen: Several small calcified granulomas in the spleen. Otherwise, unremarkable. Adrenals/Urinary Tract: Unenhanced appearance of the right kidney is normal. 1 cm low-attenuation lesion in the lower pole the left kidney is incompletely characterized on today's noncontrast CT examination, but is similar to prior study from 2010 and is statistically  likely a cyst. Exophytic 1.6 cm intermediate attenuation (40 HU) lesion in the lower pole the left kidney (coronal image 49 of series 206) is indeterminate, but appears new compared to the prior study from 2010, concerning for potential solid neoplasm. No calcifications are identified within the collecting system of either kidney, along the course of either ureter, or within the lumen of the urinary bladder. No hydroureteronephrosis. Stomach/Bowel: The unenhanced appearance of the stomach is normal. There is no pathologic dilatation of small bowel or colon. Numerous colonic diverticulae are noted, without surrounding inflammatory changes to suggest an acute diverticulitis at this time. Vascular/Lymphatic: Aortic atherosclerosis. No definite lymphadenopathy noted in the abdomen or pelvis on today's noncontrast CT examination. Reproductive: Status post hysterectomy. Ovaries are not confidently identified may be surgically absent or atrophic. Other: No significant volume of ascites.  No pneumoperitoneum. Musculoskeletal: Multiple compression fractures throughout the visualized thoracolumbar spine involving T8, T10, T12, L1, L2, L3, L4 and L5, most severe at L2 where there is approximately  90% loss of central vertebral body height (please see companion lumbar spine CT for full description of lumbar findings). There are no aggressive appearing lytic or blastic lesions noted in the visualized portions of the skeleton. IMPRESSION: 1. No acute findings in the abdomen or pelvis to account for the patient's symptoms. Specifically, no urinary tract calculi or findings of urinary tract obstruction are noted at this time. 2. Multiple compression fractures noted throughout the visualized the thoracolumbar spine. These appear to be chronic, but several are new compared to remote prior study from 2010. Please see contemporaneously obtained CT of the lumbar spine for full description of lumbar findings. 3. Aortic atherosclerosis, in  addition to left main and 2 vessel coronary artery disease. 4. Colonic diverticulosis without evidence of acute diverticulitis at this time. Electronically Signed   By: Vinnie Langton M.D.   On: 12/08/2016 10:33    Procedures Procedures (including critical care time)  Medications Ordered in ED Medications  acetaminophen (TYLENOL) tablet 1,000 mg (1,000 mg Oral Given 12/08/16 0928)  oxyCODONE (Oxy IR/ROXICODONE) immediate release tablet 2.5 mg (2.5 mg Oral Given 12/08/16 0928)  ketorolac (TORADOL) injection 9.9 mg (9.9 mg Intramuscular Given 12/08/16 0928)     Initial Impression / Assessment and Plan / ED Course  I have reviewed the triage vital signs and the nursing notes.  Pertinent labs & imaging results that were available during my care of the patient were reviewed by me and considered in my medical decision making (see chart for details).     83 yo F with a chief complaint of right-sided low back pain. Patient does have a remote history of breast cancer. We'll obtain a CT scan to further evaluate.  CT with multiple compression fractures. Similar to the 2010 but more progressed. Difficult to say if this is acute or not. Patient has no midline tenderness on my exam. I suspect these are probably old in nature. I offered a lumbar corset which she declined at this time. We'll send her home with a small amount of pain medicine. We'll discuss with her family physician today for possible change in pain management.   3:48 PM:  I have discussed the diagnosis/risks/treatment options with the patient and family and believe the pt to be eligible for discharge home to follow-up with PCP. We also discussed returning to the ED immediately if new or worsening sx occur. We discussed the sx which are most concerning (e.g., sudden worsening pain, fever, inability to tolerate by mouth, cauda equina s/sx) that necessitate immediate return. Medications administered to the patient during their visit and any new  prescriptions provided to the patient are listed below.  Medications given during this visit Medications  acetaminophen (TYLENOL) tablet 1,000 mg (1,000 mg Oral Given 12/08/16 0928)  oxyCODONE (Oxy IR/ROXICODONE) immediate release tablet 2.5 mg (2.5 mg Oral Given 12/08/16 0928)  ketorolac (TORADOL) injection 9.9 mg (9.9 mg Intramuscular Given 12/08/16 0240)     The patient appears reasonably screen and/or stabilized for discharge and I doubt any other medical condition or other [facility] requiring further screening, evaluation, or treatment in the ED at this time prior to discharge.    Final Clinical Impressions(s) / ED Diagnoses   Final diagnoses:  Compression fracture of lumbar spine, non-traumatic, initial encounter [facility])    New Prescriptions Discharge Medication List as of 12/08/2016 10:52 AM    START taking these medications   Details  oxyCODONE (ROXICODONE) 5 MG immediate release tablet Take 0.5 tablets (2.5 mg total) by mouth  every 4 (four) hours as needed for severe pain., Starting Thu 12/08/2016, [place], DO 12/08/16 1548

## 2016-12-08 NOTE — ED Triage Notes (Signed)
Pt states she reached up to get something in her cabinet on Sunday and has had pain in her lower back ever since. Denies falling, denies any injury. Pt states she  Can walk but is in terrible pain.

## 2016-12-29 ENCOUNTER — Emergency Department (HOSPITAL_COMMUNITY): Payer: Medicare Other

## 2016-12-29 ENCOUNTER — Encounter (HOSPITAL_COMMUNITY): Payer: Self-pay

## 2016-12-29 ENCOUNTER — Inpatient Hospital Stay (HOSPITAL_COMMUNITY)
Admission: EM | Admit: 2016-12-29 | Discharge: 2017-01-27 | DRG: 208 | Disposition: E | Payer: Medicare Other | Attending: Pulmonary Disease | Admitting: Pulmonary Disease

## 2016-12-29 DIAGNOSIS — R0603 Acute respiratory distress: Secondary | ICD-10-CM | POA: Diagnosis present

## 2016-12-29 DIAGNOSIS — J9811 Atelectasis: Secondary | ICD-10-CM | POA: Diagnosis present

## 2016-12-29 DIAGNOSIS — G9340 Encephalopathy, unspecified: Secondary | ICD-10-CM | POA: Diagnosis not present

## 2016-12-29 DIAGNOSIS — R791 Abnormal coagulation profile: Secondary | ICD-10-CM | POA: Diagnosis present

## 2016-12-29 DIAGNOSIS — I248 Other forms of acute ischemic heart disease: Secondary | ICD-10-CM | POA: Diagnosis present

## 2016-12-29 DIAGNOSIS — J189 Pneumonia, unspecified organism: Secondary | ICD-10-CM | POA: Diagnosis present

## 2016-12-29 DIAGNOSIS — R6521 Severe sepsis with septic shock: Secondary | ICD-10-CM | POA: Diagnosis not present

## 2016-12-29 DIAGNOSIS — Z853 Personal history of malignant neoplasm of breast: Secondary | ICD-10-CM

## 2016-12-29 DIAGNOSIS — Z7901 Long term (current) use of anticoagulants: Secondary | ICD-10-CM | POA: Diagnosis not present

## 2016-12-29 DIAGNOSIS — N179 Acute kidney failure, unspecified: Secondary | ICD-10-CM | POA: Diagnosis present

## 2016-12-29 DIAGNOSIS — A419 Sepsis, unspecified organism: Secondary | ICD-10-CM

## 2016-12-29 DIAGNOSIS — R7401 Elevation of levels of liver transaminase levels: Secondary | ICD-10-CM

## 2016-12-29 DIAGNOSIS — R740 Nonspecific elevation of levels of transaminase and lactic acid dehydrogenase [LDH]: Secondary | ICD-10-CM | POA: Diagnosis not present

## 2016-12-29 DIAGNOSIS — J9601 Acute respiratory failure with hypoxia: Secondary | ICD-10-CM | POA: Diagnosis present

## 2016-12-29 DIAGNOSIS — E872 Acidosis, unspecified: Secondary | ICD-10-CM

## 2016-12-29 DIAGNOSIS — Z66 Do not resuscitate: Secondary | ICD-10-CM | POA: Diagnosis not present

## 2016-12-29 DIAGNOSIS — J9690 Respiratory failure, unspecified, unspecified whether with hypoxia or hypercapnia: Secondary | ICD-10-CM

## 2016-12-29 DIAGNOSIS — Z6821 Body mass index (BMI) 21.0-21.9, adult: Secondary | ICD-10-CM | POA: Diagnosis not present

## 2016-12-29 DIAGNOSIS — J9602 Acute respiratory failure with hypercapnia: Secondary | ICD-10-CM | POA: Diagnosis not present

## 2016-12-29 DIAGNOSIS — J9600 Acute respiratory failure, unspecified whether with hypoxia or hypercapnia: Secondary | ICD-10-CM | POA: Diagnosis present

## 2016-12-29 DIAGNOSIS — E875 Hyperkalemia: Secondary | ICD-10-CM | POA: Diagnosis present

## 2016-12-29 DIAGNOSIS — Z79899 Other long term (current) drug therapy: Secondary | ICD-10-CM | POA: Diagnosis not present

## 2016-12-29 DIAGNOSIS — D696 Thrombocytopenia, unspecified: Secondary | ICD-10-CM | POA: Diagnosis present

## 2016-12-29 DIAGNOSIS — I5033 Acute on chronic diastolic (congestive) heart failure: Secondary | ICD-10-CM | POA: Diagnosis present

## 2016-12-29 DIAGNOSIS — F419 Anxiety disorder, unspecified: Secondary | ICD-10-CM | POA: Diagnosis not present

## 2016-12-29 DIAGNOSIS — Z87891 Personal history of nicotine dependence: Secondary | ICD-10-CM | POA: Diagnosis not present

## 2016-12-29 DIAGNOSIS — R571 Hypovolemic shock: Secondary | ICD-10-CM | POA: Diagnosis present

## 2016-12-29 DIAGNOSIS — I110 Hypertensive heart disease with heart failure: Secondary | ICD-10-CM | POA: Diagnosis present

## 2016-12-29 DIAGNOSIS — Z515 Encounter for palliative care: Secondary | ICD-10-CM | POA: Diagnosis not present

## 2016-12-29 DIAGNOSIS — G9341 Metabolic encephalopathy: Secondary | ICD-10-CM | POA: Diagnosis present

## 2016-12-29 DIAGNOSIS — I4891 Unspecified atrial fibrillation: Secondary | ICD-10-CM | POA: Diagnosis present

## 2016-12-29 DIAGNOSIS — R64 Cachexia: Secondary | ICD-10-CM | POA: Diagnosis present

## 2016-12-29 LAB — I-STAT ARTERIAL BLOOD GAS, ED
BICARBONATE: 26.4 mmol/L (ref 20.0–28.0)
O2 Saturation: 96 %
PH ART: 7.337 — AB (ref 7.350–7.450)
PO2 ART: 84 mmHg (ref 83.0–108.0)
Patient temperature: 96.4
TCO2: 28 mmol/L (ref 0–100)
pCO2 arterial: 48.6 mmHg — ABNORMAL HIGH (ref 32.0–48.0)

## 2016-12-29 LAB — COMPREHENSIVE METABOLIC PANEL
ALBUMIN: 3.2 g/dL — AB (ref 3.5–5.0)
ALT: 266 U/L — ABNORMAL HIGH (ref 14–54)
ANION GAP: 11 (ref 5–15)
AST: 285 U/L — ABNORMAL HIGH (ref 15–41)
Alkaline Phosphatase: 36 U/L — ABNORMAL LOW (ref 38–126)
BILIRUBIN TOTAL: 1.3 mg/dL — AB (ref 0.3–1.2)
BUN: 59 mg/dL — ABNORMAL HIGH (ref 6–20)
CHLORIDE: 98 mmol/L — AB (ref 101–111)
CO2: 29 mmol/L (ref 22–32)
Calcium: 8.7 mg/dL — ABNORMAL LOW (ref 8.9–10.3)
Creatinine, Ser: 1.34 mg/dL — ABNORMAL HIGH (ref 0.44–1.00)
GFR calc Af Amer: 39 mL/min — ABNORMAL LOW (ref >60)
GFR, EST NON AFRICAN AMERICAN: 34 mL/min — AB (ref >60)
Glucose, Bld: 126 mg/dL — ABNORMAL HIGH (ref 65–99)
POTASSIUM: 5.3 mmol/L — AB (ref 3.5–5.1)
Sodium: 138 mmol/L (ref 135–145)
TOTAL PROTEIN: 5.9 g/dL — AB (ref 6.5–8.1)

## 2016-12-29 LAB — CBC WITH DIFFERENTIAL/PLATELET
BASOS ABS: 0 10*3/uL (ref 0.0–0.1)
Basophils Relative: 0 %
Eosinophils Absolute: 0 10*3/uL (ref 0.0–0.7)
Eosinophils Relative: 0 %
HEMATOCRIT: 45.4 % (ref 36.0–46.0)
HEMOGLOBIN: 14.1 g/dL (ref 12.0–15.0)
LYMPHS PCT: 12 %
Lymphs Abs: 1 10*3/uL (ref 0.7–4.0)
MCH: 31.3 pg (ref 26.0–34.0)
MCHC: 31.1 g/dL (ref 30.0–36.0)
MCV: 100.7 fL — AB (ref 78.0–100.0)
Monocytes Absolute: 1 10*3/uL (ref 0.1–1.0)
Monocytes Relative: 12 %
NEUTROS ABS: 6.3 10*3/uL (ref 1.7–7.7)
Neutrophils Relative %: 76 %
PLATELETS: 120 10*3/uL — AB (ref 150–400)
RBC: 4.51 MIL/uL (ref 3.87–5.11)
RDW: 15.9 % — ABNORMAL HIGH (ref 11.5–15.5)
WBC: 8.3 10*3/uL (ref 4.0–10.5)

## 2016-12-29 LAB — URINALYSIS, ROUTINE W REFLEX MICROSCOPIC
Bilirubin Urine: NEGATIVE
Glucose, UA: NEGATIVE mg/dL
Ketones, ur: NEGATIVE mg/dL
LEUKOCYTES UA: NEGATIVE
NITRITE: NEGATIVE
Protein, ur: 100 mg/dL — AB
SPECIFIC GRAVITY, URINE: 1.018 (ref 1.005–1.030)
pH: 5 (ref 5.0–8.0)

## 2016-12-29 LAB — POC OCCULT BLOOD, ED: FECAL OCCULT BLD: POSITIVE — AB

## 2016-12-29 LAB — CBG MONITORING, ED: Glucose-Capillary: 108 mg/dL — ABNORMAL HIGH (ref 65–99)

## 2016-12-29 LAB — I-STAT CG4 LACTIC ACID, ED: LACTIC ACID, VENOUS: 2.96 mmol/L — AB (ref 0.5–1.9)

## 2016-12-29 MED ORDER — PIPERACILLIN-TAZOBACTAM 3.375 G IVPB 30 MIN
3.3750 g | Freq: Once | INTRAVENOUS | Status: AC
  Administered 2016-12-29: 3.375 g via INTRAVENOUS
  Filled 2016-12-29: qty 50

## 2016-12-29 MED ORDER — FENTANYL CITRATE (PF) 100 MCG/2ML IJ SOLN: 50.0000 ug | INTRAMUSCULAR | Status: DC | PRN

## 2016-12-29 MED ORDER — MIDAZOLAM HCL 2 MG/2ML IJ SOLN
INTRAMUSCULAR | Status: AC
  Filled 2016-12-29: qty 2

## 2016-12-29 MED ORDER — MIDAZOLAM HCL 2 MG/2ML IJ SOLN
1.0000 mg | INTRAMUSCULAR | Status: DC | PRN
  Administered 2016-12-29: 1 mg via INTRAVENOUS

## 2016-12-29 MED ORDER — DEXTROSE 5 % IV SOLN
0.0000 ug/min | INTRAVENOUS | Status: DC
  Administered 2016-12-29: 10 ug/min via INTRAVENOUS
  Filled 2016-12-29 (×2): qty 4

## 2016-12-29 MED ORDER — MIDAZOLAM HCL 2 MG/2ML IJ SOLN: 1.0000 mg | INTRAMUSCULAR | Status: DC | PRN

## 2016-12-29 MED ORDER — SODIUM CHLORIDE 0.9 % IV SOLN: 250.0000 mL | INTRAVENOUS | Status: DC | PRN

## 2016-12-29 MED ORDER — VANCOMYCIN HCL IN DEXTROSE 1-5 GM/200ML-% IV SOLN
1000.0000 mg | Freq: Once | INTRAVENOUS | Status: AC
  Administered 2016-12-29: 1000 mg via INTRAVENOUS
  Filled 2016-12-29: qty 200

## 2016-12-29 MED ORDER — SODIUM CHLORIDE 0.9 % IV SOLN
INTRAVENOUS | Status: DC
  Administered 2016-12-30: 02:00:00 via INTRAVENOUS

## 2016-12-29 MED ORDER — PHENYLEPHRINE HCL 10 MG/ML IJ SOLN
INTRAMUSCULAR | Status: DC | PRN
  Administered 2016-12-29: 80 ug

## 2016-12-29 MED ORDER — SODIUM CHLORIDE 0.9 % IV BOLUS (SEPSIS)
1000.0000 mL | Freq: Once | INTRAVENOUS | Status: AC
  Administered 2016-12-30: 1000 mL via INTRAVENOUS

## 2016-12-29 MED ORDER — PANTOPRAZOLE SODIUM 40 MG IV SOLR
40.0000 mg | Freq: Every day | INTRAVENOUS | Status: DC
  Administered 2016-12-30: 40 mg via INTRAVENOUS
  Filled 2016-12-29: qty 40

## 2016-12-29 MED ORDER — SUCCINYLCHOLINE CHLORIDE 20 MG/ML IJ SOLN
INTRAMUSCULAR | Status: AC | PRN
  Administered 2016-12-29: 75 mg via INTRAVENOUS

## 2016-12-29 MED ORDER — SODIUM CHLORIDE 0.9 % IV BOLUS (SEPSIS)
30.0000 mL/kg | Freq: Once | INTRAVENOUS | Status: AC
  Administered 2016-12-29: 1551 mL via INTRAVENOUS

## 2016-12-29 MED ORDER — ETOMIDATE 2 MG/ML IV SOLN
INTRAVENOUS | Status: AC | PRN
  Administered 2016-12-29: 15 mg via INTRAVENOUS

## 2016-12-29 MED ORDER — PHENYLEPHRINE 40 MCG/ML (10ML) SYRINGE FOR IV PUSH (FOR BLOOD PRESSURE SUPPORT)
PREFILLED_SYRINGE | INTRAVENOUS | Status: AC
  Filled 2016-12-29: qty 10

## 2016-12-29 NOTE — ED Provider Notes (Signed)
Level V caveat unstable vital signs. History is obtained from paramedics and from patient's son. Patient became progressively more weak over the past 4 days. Paramedics noted that she became unresponsive while en route. Respirations assisted by bag-valve-mask. On exam patient was initially talkative acutely ill-appearing. Respirations shallow heart irregularly irregular. Patient required intubation after discussion with his son.. She is full CODE STATUS she was intubated by Dr.Pettit under my supervision. I was present during the entire procedure  Chest x-ray viewed by me. Sepsis called due to Sirs criteria temperature, heart rate. Source of infection unclear. I had discussion with patient's son prior to intubation. He had not had discussion with patient or family whether she wished life-support therefore we intubated. Patient transiently was on Levothroid after 30 mL/kg saline intravenous bolus as she remained hypotensive.s He was also treated with phenlyephine intravenously for transient hypotension.  Patient noted to have hypercarbic respiratory failure coming to by hypoxia on arterial blood gas prior to intubation.  Intensivist service consulted for admission  Results for orders placed or performed during the hospital encounter of 01/20/2017  Blood Culture (routine x 2)  Result Value Ref Range   Specimen Description BLOOD RIGHT ANTECUBITAL    Special Requests      BOTTLES DRAWN AEROBIC AND ANAEROBIC Blood Culture adequate volume   Culture PENDING    Report Status PENDING   Comprehensive metabolic panel  Result Value Ref Range   Sodium 138 135 - 145 mmol/L   Potassium 5.3 (H) 3.5 - 5.1 mmol/L   Chloride 98 (L) 101 - 111 mmol/L   CO2 29 22 - 32 mmol/L   Glucose, Bld 126 (H) 65 - 99 mg/dL   BUN 59 (H) 6 - 20 mg/dL   Creatinine, Ser 1.34 (H) 0.44 - 1.00 mg/dL   Calcium 8.7 (L) 8.9 - 10.3 mg/dL   Total Protein 5.9 (L) 6.5 - 8.1 g/dL   Albumin 3.2 (L) 3.5 - 5.0 g/dL   AST 285 (H) 15 - 41 U/L    ALT 266 (H) 14 - 54 U/L   Alkaline Phosphatase 36 (L) 38 - 126 U/L   Total Bilirubin 1.3 (H) 0.3 - 1.2 mg/dL   GFR calc non Af Amer 34 (L) >60 mL/min   GFR calc Af Amer 39 (L) >60 mL/min   Anion gap 11 5 - 15  CBC WITH DIFFERENTIAL  Result Value Ref Range   WBC 8.3 4.0 - 10.5 K/uL   RBC 4.51 3.87 - 5.11 MIL/uL   Hemoglobin 14.1 12.0 - 15.0 g/dL   HCT 45.4 36.0 - 46.0 %   MCV 100.7 (H) 78.0 - 100.0 fL   MCH 31.3 26.0 - 34.0 pg   MCHC 31.1 30.0 - 36.0 g/dL   RDW 15.9 (H) 11.5 - 15.5 %   Platelets 120 (L) 150 - 400 K/uL   Neutrophils Relative % 76 %   Neutro Abs 6.3 1.7 - 7.7 K/uL   Lymphocytes Relative 12 %   Lymphs Abs 1.0 0.7 - 4.0 K/uL   Monocytes Relative 12 %   Monocytes Absolute 1.0 0.1 - 1.0 K/uL   Eosinophils Relative 0 %   Eosinophils Absolute 0.0 0.0 - 0.7 K/uL   Basophils Relative 0 %   Basophils Absolute 0.0 0.0 - 0.1 K/uL  Urinalysis, Routine w reflex microscopic  Result Value Ref Range   Color, Urine AMBER (A) YELLOW   APPearance HAZY (A) CLEAR   Specific Gravity, Urine 1.018 1.005 - 1.030   pH 5.0 5.0 -  8.0   Glucose, UA NEGATIVE NEGATIVE mg/dL   Hgb urine dipstick SMALL (A) NEGATIVE   Bilirubin Urine NEGATIVE NEGATIVE   Ketones, ur NEGATIVE NEGATIVE mg/dL   Protein, ur 100 (A) NEGATIVE mg/dL   Nitrite NEGATIVE NEGATIVE   Leukocytes, UA NEGATIVE NEGATIVE   RBC / HPF 0-5 0 - 5 RBC/hpf   WBC, UA 0-5 0 - 5 WBC/hpf   Bacteria, UA RARE (A) NONE SEEN   Squamous Epithelial / LPF 0-5 (A) NONE SEEN   Mucous PRESENT    Hyaline Casts, UA PRESENT   I-Stat CG4 Lactic Acid, ED  (not at  [facility])  Result Value Ref Range   Lactic Acid, Venous 2.96 (HH) 0.5 - 1.9 mmol/L   Comment NOTIFIED PHYSICIAN   I-Stat arterial blood gas, ED ([facility], [facility])  Result Value Ref Range   pH, Arterial 7.337 (L) 7.350 - 7.450   pCO2 arterial 48.6 (H) 32.0 - 48.0 mmHg   pO2, Arterial 84.0 83.0 - 108.0 mmHg   Bicarbonate 26.4 20.0 - 28.0 mmol/L   TCO2 28 0 - 100 mmol/L   O2 Saturation 96.0  %   Patient temperature 96.4 F    Collection site RADIAL, ALLEN'S TEST ACCEPTABLE    Drawn by RT    Sample type ARTERIAL   CBG monitoring, ED  Result Value Ref Range   Glucose-Capillary 108 (H) 65 - 99 mg/dL  POC occult blood, ED  Result Value Ref Range   Fecal Occult Bld POSITIVE (A) NEGATIVE   Ct L-spine No Charge  Result Date: 12/08/2016 CLINICAL DATA:  83 year old female with lumbar back pain for 5 days becoming severe. EXAM: CT LUMBAR SPINE WITHOUT CONTRAST TECHNIQUE: Multidetector CT imaging of the lumbar spine was performed without intravenous contrast administration. Multiplanar CT image reconstructions were also generated. COMPARISON:  Lumbar spine radiographs 10/07/2013. CT Abdomen and Pelvis today (reported separately) and 10/15/2008 FINDINGS: Segmentation: Normal is demonstrated on the 2015 radiographs. Alignment: Chronic levoconvex lumbar scoliosis and straightening of lumbar lordosis. Vertebrae: Osteopenia. Chronic T12 through L4 compression fractures. The T12 level it is incompletely visualized on these images. The L3 and L4 levels appears stable since 2010. The L1 level demonstrates further compression and loss of height since 2010, and there is un healed lucency of the anterior superior L1 endplate best demonstrated on sagittal image 42. Nondisplaced fracture of the right L1 pedicle (sagittal image 40). The other L1 posterior elements appear intact. Mild retropulsion of the posterior inferior endplate. Severe L2 vertebral body loss of height, progressed since 2010. Still, it is unclear whether there is any un healed lucency at the L2 vertebral body (questionably on sagittal image 48). The L2 posterior elements appear intact. Chronic retropulsion of the posterosuperior L2 endplate. Moderate to severe L5 compression fracture is new since 2010 but was present on the 2015 radiographs although there may have been further loss of height since that time. Retropulsion of the posterosuperior L5  endplate. Questionable nondisplaced fracture of the left L5 pedicle on sagittal image 49. Otherwise the L5 posterior elements appear intact. Visible sacrum and SI joints appear intact. Paraspinal and other soft tissues: Calcified aortic atherosclerosis. Abdominal and pelvic visceral details are reported separately today with the CT Abdomen and Pelvis. Posterior paraspinal soft tissues are within normal limits. Disc levels: Multilevel lumbar spinal stenosis is primarily related to compression fracture retropulsion. Moderate spinal stenosis at the L2 superior endplate level. Mild to moderate spinal stenosis at the L3-L4 level. Mild to moderate spinal stenosis at the L4-L5  level. IMPRESSION: 1. Osteopenia and diffuse lumbar compression fractures. Of these the L1 fracture appears most recent, but L2 and L5 also have progressed since the most recent comparison in 2015. If specific therapy such as vertebroplasty is desired, Lumbar MRI or Nuclear Medicine Whole-body Bone Scan would best determine acuity. 2. Mild L1 bony retropulsion and nondisplaced right L1 pedicle fracture. More pronounced bony retropulsion at the other compression fractures. Possible nondisplaced left L5 pedicle fracture. 3. Up to moderate multifactorial lumbar spinal stenosis at L1-L2, L3-L4, and L4-L5. 4.  CT Abdomen and Pelvis from today reported separately. Electronically Signed   By: Genevie Ann M.D.   On: 12/08/2016 10:33   Dg Chest Port 1 View  Result Date: 01/09/2017 CLINICAL DATA:  Hypoxia, intubated. Verify endotracheal tube and orogastric tube. EXAM: PORTABLE CHEST 1 VIEW COMPARISON:  None. FINDINGS: The tip of an endotracheal tube is 8.5 cm from the carina and further advancement by 2 cm is suggested. An orogastric tube is seen coiled back upon itself and is seen at the base of the neck in the expected location of the upper thoracic portion of the esophagus. Large right effusion is noted with vascular congestion. This obscures the right heart  border. There is aortic atherosclerosis. No acute osseous abnormality. Axillary clips are seen on the right. IMPRESSION: 1. The tip of an endotracheal tube is approximately 8.5 cm from the carina. Further advancement by 2 cm is recommended. 2. Repositioning of the orogastric tube is recommended as it is coiled upon itself and is noted at the base of the neck, along the expected location of the upper thoracic esophagus. 3. CHF with large right effusion. 4. Aortic atherosclerosis. Electronically Signed   By: Ashley Royalty M.D.   On: 01/26/2017 22:06   Dg Abd Portable 1 View  Result Date: 01/16/2017 CLINICAL DATA:  Orogastric tube EXAM: PORTABLE ABDOMEN - 1 VIEW COMPARISON:  Chest radiograph 01/10/2017. FINDINGS: The orogastric tube tip is visible on the stomach. The proximal port is not visible on this image and may be in the distal esophagus or at the EG junction. Recommend advancement by at least 5 cm for optimal placement. Multiple dilated loops of small bowel. IMPRESSION: Orogastric tube reaches the stomach, but advancement by approximately 5 cm is advisable for optimal placement. These results will be called to the ordering clinician or representative by the Radiologist Assistant, and communication documented in the PACS or zVision Dashboard. Electronically Signed   By: Andreas Newport M.D.   On: 12/28/2016 22:31   Ct Renal Stone Study  Result Date: 12/08/2016 CLINICAL DATA:  83 year old female with history of lower back pain for the past 4 days, markedly increasing in severity this morning. EXAM: CT ABDOMEN AND PELVIS WITHOUT CONTRAST TECHNIQUE: Multidetector CT imaging of the abdomen and pelvis was performed following the standard protocol without IV contrast. COMPARISON:  CT the abdomen and pelvis 10/15/2008. FINDINGS: Lower chest: Atherosclerotic calcifications in the left main, left anterior descending and right coronary arteries. Atherosclerosis in the thoracic aorta. Hepatobiliary: No definite  cystic or solid hepatic lesions are confidently identified on today's noncontrast CT examination. Small calcified granuloma in the inferior aspect of the right lobe of the liver. Gallbladder is unremarkable in appearance. Pancreas: No definite pancreatic mass or peripancreatic inflammatory changes are noted on today's noncontrast CT examination. Spleen: Several small calcified granulomas in the spleen. Otherwise, unremarkable. Adrenals/Urinary Tract: Unenhanced appearance of the right kidney is normal. 1 cm low-attenuation lesion in the lower pole the left kidney  is incompletely characterized on today's noncontrast CT examination, but is similar to prior study from 2010 and is statistically likely a cyst. Exophytic 1.6 cm intermediate attenuation (40 HU) lesion in the lower pole the left kidney (coronal image 49 of series 206) is indeterminate, but appears new compared to the prior study from 2010, concerning for potential solid neoplasm. No calcifications are identified within the collecting system of either kidney, along the course of either ureter, or within the lumen of the urinary bladder. No hydroureteronephrosis. Stomach/Bowel: The unenhanced appearance of the stomach is normal. There is no pathologic dilatation of small bowel or colon. Numerous colonic diverticulae are noted, without surrounding inflammatory changes to suggest an acute diverticulitis at this time. Vascular/Lymphatic: Aortic atherosclerosis. No definite lymphadenopathy noted in the abdomen or pelvis on today's noncontrast CT examination. Reproductive: Status post hysterectomy. Ovaries are not confidently identified may be surgically absent or atrophic. Other: No significant volume of ascites.  No pneumoperitoneum. Musculoskeletal: Multiple compression fractures throughout the visualized thoracolumbar spine involving T8, T10, T12, L1, L2, L3, L4 and L5, most severe at L2 where there is approximately 90% loss of central vertebral body height  (please see companion lumbar spine CT for full description of lumbar findings). There are no aggressive appearing lytic or blastic lesions noted in the visualized portions of the skeleton. IMPRESSION: 1. No acute findings in the abdomen or pelvis to account for the patient's symptoms. Specifically, no urinary tract calculi or findings of urinary tract obstruction are noted at this time. 2. Multiple compression fractures noted throughout the visualized the thoracolumbar spine. These appear to be chronic, but several are new compared to remote prior study from 2010. Please see contemporaneously obtained CT of the lumbar spine for full description of lumbar findings. 3. Aortic atherosclerosis, in addition to left main and 2 vessel coronary artery disease. 4. Colonic diverticulosis without evidence of acute diverticulitis at this time. Electronically Signed   By: Vinnie Langton M.D.   On: 12/08/2016 10:33  .CRITICAL CARE Performed by: Orlie Dakin Total critical care time: 35 minutes Critical care time was exclusive of separately billable procedures and treating other patients. Critical care was necessary to treat or prevent imminent or life-threatening deterioration. Critical care was time spent personally by me on the following activities: development of treatment plan with patient and/or surrogate as well as nursing, discussions with consultants, evaluation of patient's response to treatment, examination of patient, obtaining history from patient or surrogate, ordering and performing treatments and interventions, ordering and review of laboratory studies, ordering and review of radiographic studies, pulse oximetry and re-evaluation of patient's condition.   Orlie Dakin, MD 01/26/2017 2350

## 2016-12-29 NOTE — ED Provider Notes (Signed)
[place] DEPT Provider Note   CSN: [id number] Arrival date & time: 01/01/2017  2122     History   Chief Complaint Chief Complaint  Patient presents with  . Weakness    HPI Tina Wolfe is a 83 y.o. female.  The history is provided by the EMS personnel, a relative and the patient.  Illness  This is a new problem. The current episode started more than 2 days ago. The problem occurs constantly. The problem has been rapidly worsening. Associated symptoms include shortness of breath. Nothing aggravates the symptoms. Nothing relieves the symptoms.    Past Medical History:  Diagnosis Date  . A-fib ([place])   . Breast cancer, right, intraductal 01/20/1988  . Hypertension     Patient Active Problem List   Diagnosis Date Noted  . Palpitations 11/07/2013  . Atrial fibrillation with RVR ([place]) 11/07/2013  . A-fib ([place]) 11/07/2013  . Hypertension   . Breast cancer, right, intraductal 01/20/1988    Class: History of    Past Surgical History:  Procedure Laterality Date  . ABDOMINAL HYSTERECTOMY    . MASTECTOMY      OB History    No data available       Home Medications    Prior to Admission medications   Medication Sig Start Date End Date Taking? Authorizing Provider  acetaminophen (TYLENOL) 500 MG tablet Take 500 mg by mouth every 6 (six) hours as needed for moderate pain.    Historical Provider, MD  amLODipine (NORVASC) 5 MG tablet Take 5 mg by mouth daily.      Historical Provider, MD  digoxin (DIGOX) 0.25 MG tablet Take 1 tablet (250 mcg total) by mouth daily. Patient is taking 0.5 tab by mouth daily, so daily dose is 125 mcg. 01/30/15   Sherlene Shams, MD  lisinopril (PRINIVIL,ZESTRIL) 20 MG tablet Take 1 tablet (20 mg total) by mouth daily. 01/30/15   Sherlene Shams, MD  Multiple Vitamin (MULTIVITAMIN WITH MINERALS) TABS tablet Take 1 tablet by mouth daily.    Historical Provider, MD  Omega-3 Fatty Acids (FISH OIL) 1000 MG CAPS Take 1,000 mg by mouth daily.    Historical  Provider, MD  oxyCODONE (ROXICODONE) 5 MG immediate release tablet Take 0.5 tablets (2.5 mg total) by mouth every 4 (four) hours as needed for severe pain. 12/08/16   Deno Etienne, DO  raloxifene (EVISTA) 60 MG tablet Take 60 mg by mouth daily.      Historical Provider, MD  Rivaroxaban (XARELTO) 15 MG TABS tablet Take 1 tablet (15 mg total) by mouth daily with supper. 11/10/13   Seward Carol, MD  triamcinolone (NASACORT ALLERGY 24HR) 55 MCG/ACT AERO nasal inhaler Place 2 sprays into the nose daily as needed (for allergies).    Historical Provider, MD    Family History No family history on file.  Social History Social History  Substance Use Topics  . Smoking status: Former Research scientist (life sciences)  . Smokeless tobacco: Never Used  . Alcohol use No     Allergies   Patient has no known allergies.   Review of Systems Review of Systems  Unable to perform ROS: Acuity of condition  Respiratory: Positive for shortness of breath.    Physical Exam Updated Vital Signs BP (!) 88/58 (BP Location: Left Arm)   Pulse (!) 105   Temp (!) 96.4 F (35.8 C) (Rectal)   SpO2 (!) 87%   Physical Exam  Constitutional:  Cachectic  HENT:  Head: Normocephalic and atraumatic.  Eyes: Pupils  are equal, round, and reactive to light. No scleral icterus.  7mm b/l & equally reactive  Neck: No tracheal deviation present.  Cardiovascular: Intact distal pulses.   Tachycardic  Pulmonary/Chest: No stridor. She is in respiratory distress.  Tachypnea, shallow respirations, decreased breath sounds on the right  Abdominal: Soft. She exhibits no distension. There is no tenderness.  Musculoskeletal: She exhibits no deformity.  Neurological:  Very lethargic, but answering questions on arrival  Skin: Skin is dry.  Nursing note and vitals reviewed.    ED Treatments / Results  Labs (all labs ordered are listed, but only abnormal results are displayed) Labs Reviewed  CBC WITH DIFFERENTIAL/PLATELET - Abnormal; Notable for the  following:       Result Value   MCV 100.7 (*)    RDW 15.9 (*)    All other components within normal limits  I-STAT CG4 LACTIC ACID, ED - Abnormal; Notable for the following:    Lactic Acid, Venous 2.96 (*)    All other components within normal limits  CBG MONITORING, ED - Abnormal; Notable for the following:    Glucose-Capillary 108 (*)    All other components within normal limits  POC OCCULT BLOOD, ED - Abnormal; Notable for the following:    Fecal Occult Bld POSITIVE (*)    All other components within normal limits  CULTURE, BLOOD (ROUTINE X 2)  CULTURE, BLOOD (ROUTINE X 2)  COMPREHENSIVE METABOLIC PANEL  URINALYSIS, ROUTINE W REFLEX MICROSCOPIC  I-STAT ARTERIAL BLOOD GAS, ED    EKG  EKG Interpretation None       Radiology No results found.  Procedures Procedure Name: Intubation Date/Time: 12/30/2016 12:35 AM Performed by: Jenny Reichmann Pre-anesthesia Checklist: Timeout performed Oxygen Delivery Method: Ambu bag Preoxygenation: Pre-oxygenation with 100% oxygen Intubation Type: Rapid sequence Ventilation: Mask ventilation without difficulty Laryngoscope Size: Glidescope Grade View: Grade I Tube size: 7.5 mm Number of attempts: 1 Airway Equipment and Method: Rigid stylet and Video-laryngoscopy Placement Confirmation: ETT inserted through vocal cords under direct vision,  CO2 detector and Breath sounds checked- equal and bilateral Secured at: 22 cm Tube secured with: ETT holder      (including critical care time)  Medications Ordered in ED Medications  vancomycin (VANCOCIN) IVPB 1000 mg/200 mL premix (1,000 mg Intravenous New Bag/Given 01/22/2017 2144)  piperacillin-tazobactam (ZOSYN) IVPB 3.375 g (3.375 g Intravenous New Bag/Given 01/05/2017 2143)  fentaNYL (SUBLIMAZE) injection 50 mcg (not administered)  fentaNYL (SUBLIMAZE) injection 50 mcg (not administered)  midazolam (VERSED) injection 1 mg (not administered)  midazolam (VERSED) injection 1 mg (not  administered)  norepinephrine (LEVOPHED) 4 mg in dextrose 5 % 250 mL (0.016 mg/mL) infusion (10 mcg/min Intravenous New Bag/Given 01/15/2017 2159)  phenylephrine 0.4-0.9 MG/10ML-% injection (not administered)  phenylephrine (NEO-SYNEPHRINE) injection (80 mcg  Given 01/02/2017 2152)  sodium chloride 0.9 % bolus 1,551 mL (1,551 mLs Intravenous New Bag/Given 01/23/2017 2143)  etomidate (AMIDATE) injection (15 mg Intravenous Given 12/30/2016 2141)  succinylcholine (ANECTINE) injection (75 mg Intravenous Given 12/28/2016 2142)     Initial Impression / Assessment and Plan / ED Course  I have reviewed the triage vital signs and the nursing notes.  Pertinent labs & imaging results that were available during my care of the patient were reviewed by me and considered in my medical decision making (see chart for details).    Pt with h/o HTN, Afib on Xarelto, breast CA presents with lethargy & respiratory distress. EMS says they were called out for generalized weakness that has been worsening over  the last several days; she was tired, but alert and answering questions when they arrived. They said she syncopized briefly during transport and was apneic for 1-37mins requiring BVM assistance; she apparently came to on her own & was answering questions again.  VS & exam as above. Hypoxic on presentation requiring NRB and then BVM support. Initial ABG w/pH 7.0 & pCO2 >95. 96.66F rectal temp & hypotensive, so CODE SEPSIS initiated w/empiric vancomycin & Zosyn.  Hypoxia persisted despite non-invasive assistance. Code Status discussed with the son at the bedside & he said she would want "everything done". Intubated under RSI for acute hypoxic/hypercarbic respiratory failure.  CXR with large right effusion and vascular congestion; ETT noted to be high & advanced in accordance w/radiology recommendation.  Pt experienced post-intubation hypotension that did not initially respond to 9mcg phenylephrine, so Levophed gtt started and weaned  off as BPs rose to the normal range.  Cause of the Pt's symptoms unclear, but considering PNA, malignancy, and heart failure given respiratory failure and large effusion.  Will admit the Pt to the ICU for further evaluation and management.  Final Clinical Impressions(s) / ED Diagnoses   Final diagnoses:  Acute respiratory failure with hypoxia and hypercapnia (HCC)    New Prescriptions New Prescriptions   No medications on file     Jenny Reichmann, MD 12/30/16 [place], MD January 29, 2017 [phone]

## 2016-12-29 NOTE — Progress Notes (Signed)
ABG results will not cross over. ABG at 2132: PH 7.12, CO2>95, PAO2 59

## 2016-12-29 NOTE — Progress Notes (Signed)
RT assisted ED physician with intubation. ETCO2 positive color change. Bilateral breath sounds present. MD ordered ETT to be moved down 2cm per CXR. RT moved tube from 22 to 24. ABG pending. RT will continue to monitor.

## 2016-12-29 NOTE — ED Triage Notes (Signed)
Pt presents with ventilations assisted by [facility].  Per paramedic, pt had recent R hip fracture with c/o 3 day h/o weakness.  In route, pt had syncopal episode with 1-2 minutes of apnea.  Pt was hypotensive, received 248mL of NS.

## 2016-12-29 NOTE — ED Notes (Signed)
Intensive care MD at the bedside.

## 2016-12-29 NOTE — H&P (Signed)
PULMONARY / CRITICAL CARE MEDICINE   Name: Tina Wolfe MRN: [id number] DOB: 1923-10-04    ADMISSION DATE:  01/02/2017 CONSULTATION DATE:  May 3  REFERRING MD:  Dr. Winfred Leeds  CHIEF COMPLAINT:  Altered mental status  HISTORY OF PRESENT ILLNESS:   83 year old female with past medical history as below, which is significant for atrial fibrillation on Xarelto, remote breast cancer in 1940s, hypertension, and recent diagnosis of several compression fractures in her spine. She was in her usual state of health until about 4 days ago when she developed confusion, which progressed to more significant alteration of mental status until 01/24/2017 when family went to check on her and she was nearly unresponsive. EMS was called and upon their arrival she remained unresponsive with shallow respirations. There assisted ventilations with bag valve mask in route. Upon arrival to the emergency department she was more awake and able to talk to the ED staff, however, due to her degree of respiratory distress she was intubated. Chest x-ray demonstrated a right sided opacification. Laboratory evaluation significant for elevated lactic acid 2.9, elevated transaminases, hyperkalemia, and elevated serum creatinine 1.34. She will be admitted to ICU.  PAST MEDICAL HISTORY :  She  has a past medical history of A-fib ([place]); Breast cancer, right, intraductal (01/20/1988); and Hypertension.  PAST SURGICAL HISTORY: She  has a past surgical history that includes Abdominal hysterectomy and Mastectomy.  No Known Allergies  No current facility-administered medications on file prior to encounter.    Current Outpatient Prescriptions on File Prior to Encounter  Medication Sig  . acetaminophen (TYLENOL) 500 MG tablet Take 500 mg by mouth every 6 (six) hours as needed for moderate pain.  Marland Kitchen amLODipine (NORVASC) 5 MG tablet Take 5 mg by mouth daily.    . digoxin (DIGOX) 0.25 MG tablet Take 1 tablet (250 mcg total) by mouth daily.  Patient is taking 0.5 tab by mouth daily, so daily dose is 125 mcg.  . lisinopril (PRINIVIL,ZESTRIL) 20 MG tablet Take 1 tablet (20 mg total) by mouth daily.  . Multiple Vitamin (MULTIVITAMIN WITH MINERALS) TABS tablet Take 1 tablet by mouth daily.  . Omega-3 Fatty Acids (FISH OIL) 1000 MG CAPS Take 1,000 mg by mouth daily.  Marland Kitchen oxyCODONE (ROXICODONE) 5 MG immediate release tablet Take 0.5 tablets (2.5 mg total) by mouth every 4 (four) hours as needed for severe pain.  . raloxifene (EVISTA) 60 MG tablet Take 60 mg by mouth daily.    . Rivaroxaban (XARELTO) 15 MG TABS tablet Take 1 tablet (15 mg total) by mouth daily with supper.  . triamcinolone (NASACORT ALLERGY 24HR) 55 MCG/ACT AERO nasal inhaler Place 2 sprays into the nose daily as needed (for allergies).  . [DISCONTINUED] metoprolol tartrate (LOPRESSOR) 25 MG tablet Take 1 tablet (25 mg total) by mouth 2 (two) times daily. (Patient not taking: Reported on 10/30/2014)    FAMILY HISTORY:  Her indicated that her mother is deceased. She indicated that her father is deceased. She indicated that only one of her two sisters is alive.    SOCIAL HISTORY: She  reports that she has quit smoking. She has never used smokeless tobacco. She reports that she does not drink alcohol or use drugs.  REVIEW OF SYSTEMS:   Unable as patient is intubated and encephalopathic  SUBJECTIVE:    VITAL SIGNS: BP 111/83   Pulse (!) 123   Temp (!) 96.4 F (35.8 C) (Rectal)   Resp (!) 24   Ht 5\' 2"  (1.575 m)  Wt 51.7 kg (114 lb)   SpO2 (!) 84%   BMI 20.85 kg/m   HEMODYNAMICS:    VENTILATOR SETTINGS: Vent Mode: PRVC FiO2 (%):  [89 %-100 %] 89 % Set Rate:  [24 bmp] 24 bmp Vt Set:  [420 mL] 420 mL PEEP:  [5 cmH20] 5 cmH20 Plateau Pressure:  [18 cmH20] 18 cmH20  INTAKE / OUTPUT: No intake/output data recorded.  PHYSICAL EXAMINATION: General:  Frail elderly female on ventilator Neuro:  Sedated HEENT:  [place]/AT, PERRL, mild JVD Cardiovascular:   Irregularly irregular, tachycardic. Trace peripheral edema. Lungs:  Diminished right Abdomen:  Soft, nondistended Musculoskeletal:  No acute deformity Skin:  Grossly intact  LABS:  BMET  Recent Labs Lab 01/25/2017 2127  NA 138  K 5.3*  CL 98*  CO2 29  BUN 59*  CREATININE 1.34*  GLUCOSE 126*    Electrolytes  Recent Labs Lab 01/05/2017 2127  CALCIUM 8.7*    CBC  Recent Labs Lab 01/13/2017 2127  WBC 8.3  HGB 14.1  HCT 45.4  PLT 120*    Coag's No results for input(s): APTT, INR in the last 168 hours.  Sepsis Markers  Recent Labs Lab 01/04/2017 2144  LATICACIDVEN 2.96*    ABG  Recent Labs Lab 01/16/2017 2239  PHART 7.337*  PCO2ART 48.6*  PO2ART 84.0    Liver Enzymes  Recent Labs Lab 12/30/2016 2127  AST 285*  ALT 266*  ALKPHOS 36*  BILITOT 1.3*  ALBUMIN 3.2*    Cardiac Enzymes No results for input(s): TROPONINI, PROBNP in the last 168 hours.  Glucose  Recent Labs Lab 12/27/2016 2131  GLUCAP 108*    Imaging Dg Chest Port 1 View  Result Date: 01/01/2017 CLINICAL DATA:  Hypoxia, intubated. Verify endotracheal tube and orogastric tube. EXAM: PORTABLE CHEST 1 VIEW COMPARISON:  None. FINDINGS: The tip of an endotracheal tube is 8.5 cm from the carina and further advancement by 2 cm is suggested. An orogastric tube is seen coiled back upon itself and is seen at the base of the neck in the expected location of the upper thoracic portion of the esophagus. Large right effusion is noted with vascular congestion. This obscures the right heart border. There is aortic atherosclerosis. No acute osseous abnormality. Axillary clips are seen on the right. IMPRESSION: 1. The tip of an endotracheal tube is approximately 8.5 cm from the carina. Further advancement by 2 cm is recommended. 2. Repositioning of the orogastric tube is recommended as it is coiled upon itself and is noted at the base of the neck, along the expected location of the upper thoracic esophagus. 3.  CHF with large right effusion. 4. Aortic atherosclerosis. Electronically Signed   By: Ashley Royalty M.D.   On: 12/28/2016 22:06   Dg Abd Portable 1 View  Result Date: 01/22/2017 CLINICAL DATA:  Orogastric tube EXAM: PORTABLE ABDOMEN - 1 VIEW COMPARISON:  Chest radiograph 01/24/2017. FINDINGS: The orogastric tube tip is visible on the stomach. The proximal port is not visible on this image and may be in the distal esophagus or at the EG junction. Recommend advancement by at least 5 cm for optimal placement. Multiple dilated loops of small bowel. IMPRESSION: Orogastric tube reaches the stomach, but advancement by approximately 5 cm is advisable for optimal placement. These results will be called to the ordering clinician or representative by the Radiologist Assistant, and communication documented in the PACS or zVision Dashboard. Electronically Signed   By: Andreas Newport M.D.   On: 01/09/2017 22:31  STUDIES:    CULTURES: Blood 5/4 > Urine 5/4 > Tracheal asp 5/4 >  ANTIBIOTICS: Levaquin 5/4 >  SIGNIFICANT EVENTS:   LINES/TUBES: ETT 5/4 >  DISCUSSION: 83 year old female recently diagnosed with L spine stress fractures. Progressive alteration of mental status over a 4 day period. Presented to emergency Department 5/3 essentially unresponsive. She was placed on the ventilator for respiratory distress and admitted to the ICU with right lung collapse secondary to large effusion and potentially some postobstructive atelectasis.  ASSESSMENT / PLAN:  PULMONARY A: Acute hypoxemic/hypercarbic respiratory failure secondary to right-sided pleural effusion and resultant atelectasis. Etiology unclear but differential includes pneumonia with parapneumonic effusion, CHF, and malignant effusion considering history of breast cancer.  P:   Full vent support Wean FiO2 to maintain SPO2 greater than 92% VAP bundle Follow ABG and chest x-ray Empiric antibiotics as above Will need to evaluate  effusion with ultrasound for thoracentesis in the morning Would favor CT scan after thoracentesis to assess for mass  CARDIOVASCULAR A:  Atrial fibrillation with rapid ventricular response heart rate currently 100 -115 Hypertension  P:  Telemetry monitoring in ICU When necessary metoprolol IV Echocardiogram BNP Holding home Xarelto in favor of heparin infusion per pharmacy.  RENAL A:   AKI  P:   IV fluid hydration Follow urine output Follow BMP  GASTROINTESTINAL A:   Lower GIB (FOBT positive, no symptoms described)  P:   Nothing by mouth IV Protonix for stress ulcer prophylaxis Monitor for signs and symptoms of bleeding  HEMATOLOGIC A:   Anticoagulated on Xarelto  P:  Heparin infusion for A. Fib per pharmacy Follow CBC  INFECTIOUS A:   Possible Immunity acquired pneumonia P:   Antibiotics as above Follow cultures Assess Procalcitonin  ENDOCRINE A:   No acute issues P:   Follow glucose on chemistry  NEUROLOGIC A:   Acute metabolic encephalopathy P:   RASS goal: -1 to -2 PRN fentanyl/versed Consider CT head  FAMILY  - Updates: 2 sons at bedside in the emergency department updated. They wish to continue aggressive medical treatment, however, should she suffer a cardiac arrest they would not want to pursue resuscitation. DO NOT RESUSCITATE ordered.  - Inter-disciplinary family meet or Palliative Care meeting due by:  5/11  APP critical care time 45 mins.  Georgann Housekeeper, AGACNP-BC [facility] Pulmonology/Critical Care Pager [phone] or [phone]  12/30/2016 12:12 AM

## 2016-12-30 ENCOUNTER — Inpatient Hospital Stay (HOSPITAL_COMMUNITY): Payer: Medicare Other

## 2016-12-30 DIAGNOSIS — J9601 Acute respiratory failure with hypoxia: Secondary | ICD-10-CM

## 2016-12-30 DIAGNOSIS — E872 Acidosis, unspecified: Secondary | ICD-10-CM

## 2016-12-30 DIAGNOSIS — J9602 Acute respiratory failure with hypercapnia: Secondary | ICD-10-CM

## 2016-12-30 LAB — POCT I-STAT 3, ART BLOOD GAS (G3+)
ACID-BASE DEFICIT: 1 mmol/L (ref 0.0–2.0)
Bicarbonate: 24.1 mmol/L (ref 20.0–28.0)
O2 SAT: 100 %
TCO2: 25 mmol/L (ref 0–100)
pCO2 arterial: 39.4 mmHg (ref 32.0–48.0)
pH, Arterial: 7.39 (ref 7.350–7.450)
pO2, Arterial: 198 mmHg — ABNORMAL HIGH (ref 83.0–108.0)

## 2016-12-30 LAB — BASIC METABOLIC PANEL
Anion gap: 12 (ref 5–15)
BUN: 54 mg/dL — AB (ref 6–20)
CHLORIDE: 104 mmol/L (ref 101–111)
CO2: 20 mmol/L — ABNORMAL LOW (ref 22–32)
Calcium: 7.2 mg/dL — ABNORMAL LOW (ref 8.9–10.3)
Creatinine, Ser: 1.21 mg/dL — ABNORMAL HIGH (ref 0.44–1.00)
GFR calc non Af Amer: 38 mL/min — ABNORMAL LOW (ref >60)
GFR, EST AFRICAN AMERICAN: 44 mL/min — AB (ref >60)
Glucose, Bld: 133 mg/dL — ABNORMAL HIGH (ref 65–99)
POTASSIUM: 4.3 mmol/L (ref 3.5–5.1)
SODIUM: 136 mmol/L (ref 135–145)

## 2016-12-30 LAB — BRAIN NATRIURETIC PEPTIDE: B NATRIURETIC PEPTIDE 5: 1091.6 pg/mL — AB (ref 0.0–100.0)

## 2016-12-30 LAB — CBC
HCT: 42.2 % (ref 36.0–46.0)
HEMOGLOBIN: 13.3 g/dL (ref 12.0–15.0)
MCH: 30.6 pg (ref 26.0–34.0)
MCHC: 31.5 g/dL (ref 30.0–36.0)
MCV: 97.2 fL (ref 78.0–100.0)
Platelets: 116 10*3/uL — ABNORMAL LOW (ref 150–400)
RBC: 4.34 MIL/uL (ref 3.87–5.11)
RDW: 15.5 % (ref 11.5–15.5)
WBC: 14.3 10*3/uL — ABNORMAL HIGH (ref 4.0–10.5)

## 2016-12-30 LAB — DIGOXIN LEVEL: Digoxin Level: <0.2 ng/mL — ABNORMAL LOW (ref 0.8–2.0)

## 2016-12-30 LAB — PHOSPHORUS: PHOSPHORUS: 3.7 mg/dL (ref 2.5–4.6)

## 2016-12-30 LAB — HEPARIN LEVEL (UNFRACTIONATED)

## 2016-12-30 LAB — APTT: aPTT: 33 seconds (ref 24–36)

## 2016-12-30 LAB — GLUCOSE, CAPILLARY: GLUCOSE-CAPILLARY: 80 mg/dL (ref 65–99)

## 2016-12-30 LAB — STREP PNEUMONIAE URINARY ANTIGEN: Strep Pneumo Urinary Antigen: NEGATIVE

## 2016-12-30 LAB — PROCALCITONIN: Procalcitonin: <0.1 ng/mL

## 2016-12-30 LAB — SALICYLATE LEVEL: Salicylate Lvl: <7 mg/dL (ref 2.8–30.0)

## 2016-12-30 LAB — LACTIC ACID, PLASMA: Lactic Acid, Venous: 2.3 mmol/L (ref 0.5–1.9)

## 2016-12-30 LAB — AMYLASE: Amylase: 501 U/L — ABNORMAL HIGH (ref 28–100)

## 2016-12-30 LAB — LIPASE, BLOOD: LIPASE: 49 U/L (ref 11–51)

## 2016-12-30 LAB — MAGNESIUM: MAGNESIUM: 2.2 mg/dL (ref 1.7–2.4)

## 2016-12-30 LAB — ACETAMINOPHEN LEVEL

## 2016-12-30 LAB — MRSA PCR SCREENING: MRSA BY PCR: NEGATIVE

## 2016-12-30 MED ORDER — SODIUM CHLORIDE 0.9 % IV SOLN: INTRAVENOUS | Status: DC | PRN

## 2016-12-30 MED ORDER — MORPHINE BOLUS VIA INFUSION
5.0000 mg | INTRAVENOUS | Status: DC | PRN
Start: 2016-12-30 — End: 2016-12-31
  Filled 2016-12-30: qty 20

## 2016-12-30 MED ORDER — ORAL CARE MOUTH RINSE
15.0000 mL | Freq: Two times a day (BID) | OROMUCOSAL | Status: DC
  Administered 2016-12-30: 15 mL via OROMUCOSAL

## 2016-12-30 MED ORDER — ORAL CARE MOUTH RINSE
15.0000 mL | Freq: Four times a day (QID) | OROMUCOSAL | Status: DC
  Administered 2016-12-30: 15 mL via OROMUCOSAL

## 2016-12-30 MED ORDER — FENTANYL CITRATE (PF) 100 MCG/2ML IJ SOLN: 25.0000 ug | INTRAMUSCULAR | Status: DC | PRN

## 2016-12-30 MED ORDER — SODIUM CHLORIDE 0.9 % IV SOLN
1.0000 mg/h | INTRAVENOUS | Status: DC
  Filled 2016-12-30: qty 10

## 2016-12-30 MED ORDER — MIDAZOLAM HCL 2 MG/2ML IJ SOLN: 1.0000 mg | INTRAMUSCULAR | Status: DC | PRN

## 2016-12-30 MED ORDER — METOPROLOL TARTRATE 5 MG/5ML IV SOLN: 2.5000 mg | INTRAVENOUS | Status: DC | PRN

## 2016-12-30 MED ORDER — LEVOFLOXACIN IN D5W 500 MG/100ML IV SOLN
500.0000 mg | INTRAVENOUS | Status: DC
  Administered 2016-12-30: 500 mg via INTRAVENOUS
  Filled 2016-12-30: qty 100

## 2016-12-30 MED ORDER — SODIUM CHLORIDE 0.9 % IV SOLN
0.0000 ug/min | INTRAVENOUS | Status: DC
  Administered 2016-12-30: 75 ug/min via INTRAVENOUS
  Administered 2016-12-30: 20 ug/min via INTRAVENOUS
  Administered 2016-12-30: 75 ug/min via INTRAVENOUS
  Filled 2016-12-30 (×3): qty 1

## 2016-12-30 MED ORDER — CHLORHEXIDINE GLUCONATE 0.12% ORAL RINSE (MEDLINE KIT)
15.0000 mL | Freq: Two times a day (BID) | OROMUCOSAL | Status: DC
  Administered 2016-12-30 (×2): 15 mL via OROMUCOSAL

## 2016-12-30 MED ORDER — HEPARIN (PORCINE) IN NACL 100-0.45 UNIT/ML-% IJ SOLN
750.0000 [IU]/h | INTRAMUSCULAR | Status: DC
  Administered 2016-12-30: 750 [IU]/h via INTRAVENOUS
  Filled 2016-12-30: qty 250

## 2016-12-30 NOTE — Progress Notes (Signed)
ANTICOAGULATION CONSULT NOTE - Initial Consult  Pharmacy Consult for heparin Indication: atrial fibrillation  No Known Allergies  Patient Measurements: Height: 5\' 3"  (160 cm) Weight: 118 lb 9.7 oz (53.8 kg) IBW/kg (Calculated) : 52.4 Heparin Dosing Weight: 53.8 Kg  Vital Signs: Temp: 96.4 F (35.8 C) (05/03 2130) Temp Source: Rectal (05/03 2130) BP: 81/52 (05/04 0031) Pulse Rate: 117 (05/04 0053)  Labs:  Recent Labs  12/28/2016 2127 12/30/16 0126  HGB 14.1  --   HCT 45.4  --   PLT 120*  --   APTT  --  33  CREATININE 1.34*  --     Estimated Creatinine Clearance: 23.1 mL/min (A) (by C-G formula based on SCr of 1.34 mg/dL (H)).   Medical History: Past Medical History:  Diagnosis Date  . A-fib ([place])   . Breast cancer, right, intraductal 01/20/1988  . Hypertension     Medications:  Prescriptions Prior to Admission  Medication Sig Dispense Refill Last Dose  . acetaminophen (TYLENOL) 500 MG tablet Take 500 mg by mouth every 6 (six) hours as needed for moderate pain.   Unknown at Unknown time  . amLODipine (NORVASC) 5 MG tablet Take 5 mg by mouth daily.     01/07/2015 at Unknown time  . digoxin (DIGOX) 0.25 MG tablet Take 1 tablet (250 mcg total) by mouth daily. Patient is taking 0.5 tab by mouth daily, so daily dose is 125 mcg.     . lisinopril (PRINIVIL,ZESTRIL) 20 MG tablet Take 1 tablet (20 mg total) by mouth daily.     . Multiple Vitamin (MULTIVITAMIN WITH MINERALS) TABS tablet Take 1 tablet by mouth daily.   01/07/2015 at Unknown time  . Omega-3 Fatty Acids (FISH OIL) 1000 MG CAPS Take 1,000 mg by mouth daily.   Unknown at Unknown time  . oxyCODONE (ROXICODONE) 5 MG immediate release tablet Take 0.5 tablets (2.5 mg total) by mouth every 4 (four) hours as needed for severe pain. 5 tablet 0   . raloxifene (EVISTA) 60 MG tablet Take 60 mg by mouth daily.     Unknown at Unknown time  . Rivaroxaban (XARELTO) 15 MG TABS tablet Take 1 tablet (15 mg total) by mouth daily with  supper. 30 tablet 3 Unknown at Unknown time  . triamcinolone (NASACORT ALLERGY 24HR) 55 MCG/ACT AERO nasal inhaler Place 2 sprays into the nose daily as needed (for allergies).   Unknown at Unknown time   Assessment: Tina Wolfe is a 83 yo female on Xarelto PTA for atrial fibrillation admitted after being found unresponsive. The patient is intubated and unable to verify the last PTA dose. HgB 14.1, PLT 120, Fecal Occult Positive (provider aware) - will avoid bolus and monitor closely  Goal of Therapy:  Heparin level 0.3-0.7 units/ml Monitor platelets by anticoagulation protocol: Yes   Plan:  Start heparin infusion at 750 units/hr Check anti-Xa level in 8 hours and daily while on heparin Continue to monitor H&H and platelets  [name] 12/30/2016,2:53 AM

## 2016-12-30 NOTE — Procedures (Signed)
Extubation Procedure Note  Patient Details:   Name: Tina Wolfe DOB: 04/02/1924 MRN: [id number]   Airway Documentation:     Evaluation  O2 sats: stable throughout Complications: No apparent complications Patient did tolerate procedure well. Bilateral Breath Sounds: Diminished   Yes  Positive for cuff leak. Patient extubated with the withdrawal of care protocol. Patient placed on 2 Lpm nasal cannula. With no signs of dyspnea or stridor noted. Family and RN at bedside. Patient resting comfortably.   Myrtie Neither 12/30/2016, 1:09 PM

## 2016-12-30 NOTE — Procedures (Signed)
Arterial Catheter Insertion Procedure Note Tina Wolfe [id number] 05/27/1924  Procedure: Insertion of Arterial Catheter  Indications: Blood pressure monitoring and Frequent blood sampling  Procedure Details Consent: Unable to obtain consent because of altered level of consciousness. Time Out: Verified patient identification, verified procedure, site/side was marked, verified correct patient position, special equipment/implants available, medications/allergies/relevent history reviewed, required imaging and test results available.  Performed  Maximum sterile technique was used including antiseptics, cap, gloves, gown, hand hygiene, mask and sheet. Skin prep: Chlorhexidine; local anesthetic administered 20 gauge catheter was inserted into right radial artery using the Seldinger technique.  Evaluation Blood flow good; BP tracing good. Complications: No apparent complications.   Tina Wolfe 12/30/2016

## 2016-12-30 NOTE — Progress Notes (Signed)
   [facility]   > pt has not received sedation since being up in ICU.  She has been telling us ICU team that she DID NOT want to be intubated. She is with it and understands that she has resp failure 2/2 PNA and CHF exacerbation.  I have spoke with her son and sister and daughter in law.  They have spoken with the pt as well and confirmed that she did NOT want to be intubated.  They respect her decision of  Being DNR and now comfort care.  Pt said she did not want any further treatment for PNA..  Family agree with pt and siad this is what she wants even before she got sick.  I mentioned to pt and family that most likely she will not make it when we extubate her.  They all understand and respect her decision.   > will order comfort care order set > d/c meds > start morphine drip if needed for resp distress.   > plan extensively d/w pt and family   J. Shirl Harris, MD 12/30/2016, 12:39 PM [facility] Pulmonary and Critical Care Pager [phone] After 3 pm or if no answer, call [phone]

## 2016-12-30 NOTE — Progress Notes (Signed)
Responded to consult for end of life. DNR pt is transitioned to comfort care, but not now dying -- rather opening eyes and speaking with me and enjoying the prayer she requested. Provided spiritual/emotional support to and prayer with pt and family member in rm, which they appreciated. Advised that they can ask nurse to call for a chaplain at another time if they wish.   12/30/16 1500  Clinical Encounter Type  Visited With Patient and family together;Health care provider  Visit Type Initial;Psychological support;Spiritual support;Social support;Critical Care  Spiritual Encounters  Spiritual Needs Prayer;Emotional  Stress Factors  Patient Stress Factors Health changes;Loss of control;Other (Comment) (transitioning to comfort care)  Family Stress Factors Family relationships;Health changes;Loss;Loss of control   Gerrit Heck, Chaplain

## 2016-12-30 NOTE — Progress Notes (Signed)
CRITICAL VALUE ALERT  Critical value received:  Lactic Acid 2.3  Date of notification:  12/30/2016  Time of notification: 0315  Critical value read back: Yes  Nurse who received alert:  Lonn Georgia  MD notified (1st page):    Time of first page:    MD notified (2nd page):  Time of second page:  Responding MD:  Elsworth Soho  Time MD responded:  1886

## 2016-12-30 NOTE — H&P (Signed)
PULMONARY / CRITICAL CARE MEDICINE   Name: Tina Wolfe MRN: [id number] DOB: Dec 02, 1923    ADMISSION DATE:  01/07/2017 CONSULTATION DATE:  May 3  REFERRING MD:  Dr. Winfred Leeds  CHIEF COMPLAINT:  Altered mental status  HISTORY OF PRESENT ILLNESS:   83 year old female with past medical history as below, which is significant for atrial fibrillation on Xarelto, remote breast cancer in 1940s, hypertension, and recent diagnosis of several compression fractures in her spine. She was in her usual state of health until about 4 days ago when she developed confusion, which progressed to more significant alteration of mental status until 01/26/2017 when family went to check on her and she was nearly unresponsive. EMS was called and upon their arrival she remained unresponsive with shallow respirations. There assisted ventilations with bag valve mask in route. Upon arrival to the emergency department she was more awake and able to talk to the ED staff, however, due to her degree of respiratory distress she was intubated. Chest x-ray demonstrated a right sided opacification. Laboratory evaluation significant for elevated lactic acid 2.9, elevated transaminases, hyperkalemia, and elevated serum creatinine 1.34. She will be admitted to ICU.   SUBJECTIVE:  No issues since admission. Cutting down on Neosynephrine. On heparin drip.    VITAL SIGNS: BP 92/67   Pulse (!) 127   Temp 97.3 F (36.3 C) (Oral)   Resp (!) 24   Ht 5\' 3"  (1.6 m)   Wt 53.8 kg (118 lb 9.7 oz)   SpO2 100%   BMI 21.01 kg/m   HEMODYNAMICS:    VENTILATOR SETTINGS: Vent Mode: PRVC FiO2 (%):  [50 %-100 %] 50 % Set Rate:  [24 bmp] 24 bmp Vt Set:  [420 mL] 420 mL PEEP:  [5 cmH20] 5 cmH20 Plateau Pressure:  [18 cmH20-27 cmH20] 23 cmH20  INTAKE / OUTPUT: I/O last 3 completed shifts: In: 2244.7 [I.V.:443.7; IV [id number]:9892] Out: 15 [Urine:15]  PHYSICAL EXAMINATION: General:  Frail elderly female on ventilator Neuro:  Sedated,  on and off awake, follows commands. (-)  lateralizing signs.  HEENT:  [place]/AT, PERRL, mild JVD Cardiovascular:  Irregularly irregular, tachycardic. Trace peripheral edema. Lungs:  Diminished right lung. Some crackles in R base, L is clear Abdomen:  Soft, nondistended Musculoskeletal:  No acute deformity Skin:  Grossly intact  LABS:  BMET  Recent Labs Lab 12/28/2016 2127 12/30/16 0403  NA 138 136  K 5.3* 4.3  CL 98* 104  CO2 29 20*  BUN 59* 54*  CREATININE 1.34* 1.21*  GLUCOSE 126* 133*    Electrolytes  Recent Labs Lab 01/15/2017 2127 12/30/16 0403  CALCIUM 8.7* 7.2*  MG  --  2.2  PHOS  --  3.7    CBC  Recent Labs Lab 01/10/2017 2127 12/30/16 0403  WBC 8.3 14.3*  HGB 14.1 13.3  HCT 45.4 42.2  PLT 120* 116*    Coag's  Recent Labs Lab 12/30/16 0126  APTT 33    Sepsis Markers  Recent Labs Lab 01/26/2017 2144 12/30/16 0223  LATICACIDVEN 2.96* 2.3*  PROCALCITON  --  <0.10    ABG  Recent Labs Lab 01/21/2017 2239 12/30/16 0116  PHART 7.337* 7.390  PCO2ART 48.6* 39.4  PO2ART 84.0 198.0*    Liver Enzymes  Recent Labs Lab 01/24/2017 2127  AST 285*  ALT 266*  ALKPHOS 36*  BILITOT 1.3*  ALBUMIN 3.2*    Cardiac Enzymes No results for input(s): TROPONINI, PROBNP in the last 168 hours.  Glucose  Recent Labs Lab 01/23/2017 2131 12/30/16  0106  GLUCAP 108* 80    Imaging Dg Chest Port 1 View  Result Date: 12/30/2016 CLINICAL DATA:  Intubation. EXAM: PORTABLE CHEST 1 VIEW COMPARISON:  12/28/2016.  10/30/2014. FINDINGS: Reposition of endotracheal tube, its tip is 1.2 cm above the lower portion of the carina. Proximal repositioning should be considered. NG tube has been repositioned and its tip is now below left hemidiaphragm. Progressive opacification right hemithorax again noted consistent with enlarging right effusion. Underlying pulmonary infiltrate and atelectasis most likely present. Heart size difficult to assess due to opacification right  hemithorax . No pneumothorax. IMPRESSION: 1. Interim repositioning of endotracheal tube, its tip is 1.2 cm above the lower portion of the carina. Proximal repositioning of approximately 2 cm should be considered. 2. NG tube has been repositioned, its tip is below the left hemidiaphragm. 3. Progressive opacification right hemithorax consistent with enlarging right pleural effusion. Underlying pulmonary infiltrate and atelectasis most likely present. These results will be called to the ordering clinician or representative by the Radiologist Assistant, and communication documented in the PACS or zVision Dashboard. Electronically Signed   By: Marcello Moores  Register   On: 12/30/2016 07:05   Dg Chest Port 1 View  Result Date: 01/03/2017 CLINICAL DATA:  Hypoxia, intubated. Verify endotracheal tube and orogastric tube. EXAM: PORTABLE CHEST 1 VIEW COMPARISON:  None. FINDINGS: The tip of an endotracheal tube is 8.5 cm from the carina and further advancement by 2 cm is suggested. An orogastric tube is seen coiled back upon itself and is seen at the base of the neck in the expected location of the upper thoracic portion of the esophagus. Large right effusion is noted with vascular congestion. This obscures the right heart border. There is aortic atherosclerosis. No acute osseous abnormality. Axillary clips are seen on the right. IMPRESSION: 1. The tip of an endotracheal tube is approximately 8.5 cm from the carina. Further advancement by 2 cm is recommended. 2. Repositioning of the orogastric tube is recommended as it is coiled upon itself and is noted at the base of the neck, along the expected location of the upper thoracic esophagus. 3. CHF with large right effusion. 4. Aortic atherosclerosis. Electronically Signed   By: Ashley Royalty M.D.   On: 01/24/2017 22:06   Dg Abd Portable 1 View  Result Date: 01/03/2017 CLINICAL DATA:  Orogastric tube EXAM: PORTABLE ABDOMEN - 1 VIEW COMPARISON:  Chest radiograph 01/14/2017. FINDINGS:  The orogastric tube tip is visible on the stomach. The proximal port is not visible on this image and may be in the distal esophagus or at the EG junction. Recommend advancement by at least 5 cm for optimal placement. Multiple dilated loops of small bowel. IMPRESSION: Orogastric tube reaches the stomach, but advancement by approximately 5 cm is advisable for optimal placement. These results will be called to the ordering clinician or representative by the Radiologist Assistant, and communication documented in the PACS or zVision Dashboard. Electronically Signed   By: Andreas Newport M.D.   On: 01/01/2017 22:31   US Abdomen Limited Ruq  Result Date: 12/30/2016 CLINICAL DATA:  Elevated transaminases. EXAM: US ABDOMEN LIMITED - RIGHT UPPER QUADRANT COMPARISON:  KUB 01/25/2017.  CT 12/08/2016. FINDINGS: Gallbladder: No gallstones or wall thickening visualized. No sonographic Murphy sign noted by sonographer. Exam slightly limited due to patient's inability to turn. Common bile duct: Diameter: 2.0 mm Liver: 7 mm small echogenic focus with shadowing consistent granuloma. Similar finding noted on prior CT. Small right pleural effusion incidentally noted. IMPRESSION: 1. No gallstones  or biliary distention. 2. Liver echotexture normal. No focal hepatic abnormality identified. 3.  Small right pleural effusion incidentally noted. Electronically Signed   By: Marcello Moores  Register   On: 12/30/2016 09:21     STUDIES:    CULTURES: Blood 5/4 > Urine 5/4 > Tracheal asp 5/4 >  ANTIBIOTICS: Levaquin 5/4 >  SIGNIFICANT EVENTS:   LINES/TUBES: ETT 5/4 >  DISCUSSION: 83 year old female recently diagnosed with L spine stress fractures. Progressive alteration of mental status over a 4 day period. Presented to emergency Department 5/3 essentially unresponsive. She was placed on the ventilator for respiratory distress and admitted to the ICU with right lung collapse secondary to large effusion and potentially some  postobstructive atelectasis.  ASSESSMENT / PLAN:  PULMONARY A: Acute hypoxemic/hypercarbic respiratory failure 2/2 R PNA + atelectasis + effusion (parapneumonic). Differentials is CHF exacerbation, demand ischemia, possible mets from breast CA P:   Full vent support Wean FiO2 to maintain SPO2 greater than 92% VAP bundle Empiric antibiotics as above. Plan for chest ct scan this am.  Will need thoracentesis. Tailor abx pending ct scan   CARDIOVASCULAR A:  Atrial fibrillation with rapid ventricular response Possible demand ischemia H/O Hypertension. Now with low BP  P:  Weaning off neosynephrine Echocardiogram Trend troponin Holding home Xarelto in favor of heparin infusion per pharmacy.   RENAL A:   AKI  P:   IV fluid hydration >> will wait for chest ct scan and consider d/c IVF and start TF after scan.  Follow urine output Follow BMP   GASTROINTESTINAL A:   Lower GIB (FOBT positive, no symptoms described)  P:   Nothing by mouth IV Protonix for stress ulcer prophylaxis Monitor for signs and symptoms of bleeding  Observing Hb and Hct while on heparin drip   HEMATOLOGIC A:   Anticoagulated on Xarelto  P:  Heparin infusion for A. Fib per pharmacy Follow CBC   INFECTIOUS A:   Possible community acquired pneumonia P:   Antibiotics as above Follow cultures Assess Procalcitonin > reassuring.    ENDOCRINE A:   No acute issues P:   Follow glucose on chemistry   NEUROLOGIC A:   Acute metabolic encephalopathy, improved.  P:   RASS goal: -1 to -2 PRN fentanyl/versed She is starting to wake up and follow commands and no obvious deficits. Will hold off on cranial ct scan.    FAMILY  - Updates: 2 sons at bedside in the emergency department updated. They wish to continue aggressive medical treatment, however, should she suffer a cardiac arrest they would not want to pursue resuscitation. DO NOT RESUSCITATE ordered.  - Inter-disciplinary family meet  or Palliative Care meeting due by:  5/11    I spent  30   minutes of Critical Care time with this patient today.   Monica Becton, MD 12/30/2016, 9:52 AM [facility] Pager [phone] After 3 pm or if no answer, call [phone]

## 2016-12-30 NOTE — Progress Notes (Signed)
Patient denies pain and request not to have medication at this time. Morphine drip held at this time patient resting comfortable with family at bedside.

## 2016-12-30 NOTE — Progress Notes (Signed)
[facility] Progress Note Patient Name: Tina Wolfe DOB: 1923/12/09 MRN: [id number]   Date of Service  12/30/2016  HPI/Events of Note  Patient is DNR and comfort measures. Request to transfer to a Palliative Care bed.   eICU Interventions  Will transfer patient to a palliative care bed.      Intervention Category Intermediate Interventions: Other:  Lysle Dingwall 12/30/2016, 7:52 PM

## 2016-12-31 DIAGNOSIS — R7401 Elevation of levels of liver transaminase levels: Secondary | ICD-10-CM

## 2016-12-31 DIAGNOSIS — R740 Nonspecific elevation of levels of transaminase and lactic acid dehydrogenase [LDH]: Secondary | ICD-10-CM

## 2016-12-31 DIAGNOSIS — Z515 Encounter for palliative care: Secondary | ICD-10-CM

## 2016-12-31 LAB — HEPATITIS PANEL, ACUTE
HCV AB: 0.4 {s_co_ratio} (ref 0.0–0.9)
HEP A IGM: NEGATIVE
Hep B C IgM: NEGATIVE
Hepatitis B Surface Ag: NEGATIVE

## 2016-12-31 LAB — LEGIONELLA PNEUMOPHILA SEROGP 1 UR AG: L. pneumophila Serogp 1 Ur Ag: NEGATIVE

## 2016-12-31 MED ORDER — LORAZEPAM 0.5 MG PO TABS: 0.5000 mg | ORAL_TABLET | Freq: Four times a day (QID) | ORAL | Status: DC | PRN

## 2016-12-31 MED ORDER — LORAZEPAM 2 MG/ML IJ SOLN: 0.5000 mg | Freq: Four times a day (QID) | INTRAMUSCULAR | Status: DC | PRN

## 2016-12-31 MED ORDER — WHITE PETROLATUM GEL
Status: AC
  Administered 2016-12-31: 11:00:00
  Filled 2016-12-31: qty 1

## 2016-12-31 MED ORDER — LORAZEPAM 2 MG/ML IJ SOLN: 0.5000 mg | INTRAMUSCULAR | Status: DC | PRN

## 2016-12-31 MED ORDER — MORPHINE SULFATE (PF) 4 MG/ML IV SOLN: 1.0000 mg | INTRAVENOUS | Status: DC | PRN

## 2016-12-31 MED ORDER — ACETAMINOPHEN 500 MG PO TABS
500.0000 mg | ORAL_TABLET | Freq: Four times a day (QID) | ORAL | Status: DC | PRN
  Administered 2016-12-31: 500 mg via ORAL
  Filled 2016-12-31: qty 1

## 2017-01-03 ENCOUNTER — Telehealth: Payer: Self-pay

## 2017-01-03 LAB — CULTURE, BLOOD (ROUTINE X 2)
CULTURE: NO GROWTH
Special Requests: ADEQUATE

## 2017-01-03 NOTE — Telephone Encounter (Signed)
On 2017-01-08 I received a death certificate from [place] (original). The death certificate is for burial. The patient is a patient of [facility]. The death certificate will be taken to [facility]) this am for signature.  On 01-08-17 I received the death certificate back from [place]. I got the death certificate ready and called the funeral home to let them know the death certificate is ready for pickup.

## 2017-01-04 LAB — CULTURE, BLOOD (ROUTINE X 2)
Culture: NO GROWTH
Special Requests: ADEQUATE

## 2017-01-27 NOTE — Progress Notes (Signed)
PULMONARY / CRITICAL CARE MEDICINE   Name: Tina Wolfe MRN: [id number] DOB: Feb 10, 1924    ADMISSION DATE:  01/07/2017 CONSULTATION DATE:  May 3  REFERRING MD:  Dr. Winfred Leeds  CHIEF COMPLAINT:  Altered mental status  HISTORY OF PRESENT ILLNESS:   83 year old female with past medical history as below, which is significant for atrial fibrillation on Xarelto, remote breast cancer in 1940s, hypertension, and recent diagnosis of several compression fractures in her spine. She was in her usual state of health until about 4 days ago when she developed confusion, which progressed to more significant alteration of mental status until 12/28/2016 when family went to check on her and she was nearly unresponsive. EMS was called and upon their arrival she remained unresponsive with shallow respirations. There assisted ventilations with bag valve mask in route. Upon arrival to the emergency department she was more awake and able to talk to the ED staff, however, due to her degree of respiratory distress she was intubated. Chest x-ray demonstrated a right sided opacification. Laboratory evaluation significant for elevated lactic acid 2.9, elevated transaminases, hyperkalemia, and elevated serum creatinine 1.34. She will be admitted to ICU.   SUBJECTIVE:  On floor for palliation but awake and alert  VITAL SIGNS: BP (!) 66/40 (BP Location: Left Arm)   Pulse (!) 108   Temp 98.3 F (36.8 C) (Axillary)   Resp (!) 30   Ht 5\' 3"  (1.6 m)   Wt 118 lb 9.7 oz (53.8 kg)   SpO2 (!) 86%   BMI 21.01 kg/m   HEMODYNAMICS:    VENTILATOR SETTINGS: Vent Mode: PRVC FiO2 (%):  [50 %] 50 % Set Rate:  [24 bmp] 24 bmp Vt Set:  [420 mL] 420 mL PEEP:  [5 cmH20] 5 cmH20 Plateau Pressure:  [23 cmH20] 23 cmH20  INTAKE / OUTPUT: I/O last 3 completed shifts: In: 3350.4 [I.V.:1549.4; IV Piggyback:1801] Out: 75 [Urine:75]  PHYSICAL EXAMINATION: General:  Frail female NAD at rest HEENT: MM pink/moist PSY:nl  affect Neuro: intact CV: s1s2 rrr, no m/r/g PULM: even/non-labored, lungs bilaterally decreased ZH:GDJM, non-tender, bsx4 active  Extremities: warm/dry, + edema  Skin: no rashes or lesions   LABS:  BMET  Recent Labs Lab 01/05/2017 2127 12/30/16 0403  NA 138 136  K 5.3* 4.3  CL 98* 104  CO2 29 20*  BUN 59* 54*  CREATININE 1.34* 1.21*  GLUCOSE 126* 133*    Electrolytes  Recent Labs Lab 01/19/2017 2127 12/30/16 0403  CALCIUM 8.7* 7.2*  MG  --  2.2  PHOS  --  3.7    CBC  Recent Labs Lab 12/28/2016 2127 12/30/16 0403  WBC 8.3 14.3*  HGB 14.1 13.3  HCT 45.4 42.2  PLT 120* 116*    Coag's  Recent Labs Lab 12/30/16 0126  APTT 33    Sepsis Markers  Recent Labs Lab 12/30/2016 2144 12/30/16 0223  LATICACIDVEN 2.96* 2.3*  PROCALCITON  --  <0.10    ABG  Recent Labs Lab 01/24/2017 2239 12/30/16 0116  PHART 7.337* 7.390  PCO2ART 48.6* 39.4  PO2ART 84.0 198.0*    Liver Enzymes  Recent Labs Lab 01/19/2017 2127  AST 285*  ALT 266*  ALKPHOS 36*  BILITOT 1.3*  ALBUMIN 3.2*    Cardiac Enzymes No results for input(s): TROPONINI, PROBNP in the last 168 hours.  Glucose  Recent Labs Lab 01/26/2017 2131 12/30/16 0106  GLUCAP 108* 80    Imaging Dg Chest Port 1 View  Result Date: 12/30/2016 CLINICAL DATA:  Onset  of contused fusion approximately 5 days ago in patient with history of breast cancer. Intubated patient. EXAM: PORTABLE CHEST 1 VIEW COMPARISON:  Single-view of the chest 01/05/2017 and 12/30/2016. FINDINGS: Endotracheal tube remains in place with the tip 1.5 cm above the carina. NG tube is looped in the pharynx with the tip projecting just below the left hemidiaphragm. Right greater than left pleural effusions and airspace disease persist. There has been some improvement in aeration in the right chest. IMPRESSION: NG tube appears looped in the phalanx. Tip of the tube projects just below the left hemidiaphragm with the side port projecting in the  lower left chest. Endotracheal tube tip is 1.5 cm above the carina. Right much greater than left pleural effusions and airspace disease. Aeration in the upper right chest is mildly improved. Electronically Signed   By: Inge Rise M.D.   On: 12/30/2016 10:26   US Abdomen Limited Ruq  Result Date: 12/30/2016 CLINICAL DATA:  Elevated transaminases. EXAM: US ABDOMEN LIMITED - RIGHT UPPER QUADRANT COMPARISON:  KUB 01/20/2017.  CT 12/08/2016. FINDINGS: Gallbladder: No gallstones or wall thickening visualized. No sonographic Murphy sign noted by sonographer. Exam slightly limited due to patient's inability to turn. Common bile duct: Diameter: 2.0 mm Liver: 7 mm small echogenic focus with shadowing consistent granuloma. Similar finding noted on prior CT. Small right pleural effusion incidentally noted. IMPRESSION: 1. No gallstones or biliary distention. 2. Liver echotexture normal. No focal hepatic abnormality identified. 3.  Small right pleural effusion incidentally noted. Electronically Signed   By: Marcello Moores  Register   On: 12/30/2016 09:21     STUDIES:    CULTURES: Blood 5/4 > Urine 5/4 > Tracheal asp 5/4 >  ANTIBIOTICS: Levaquin 5/4 >5/4  SIGNIFICANT EVENTS:   LINES/TUBES: ETT 5/4 >  DISCUSSION: 83 year old female recently diagnosed with L spine stress fractures. Progressive alteration of mental status over a 4 day period. Presented to emergency Department 5/3 essentially unresponsive. She was placed on the ventilator for respiratory distress and admitted to the ICU with right lung collapse secondary to large effusion and potentially some postobstructive atelectasis. 5/4 full dnr with palliation. Not on drips, awake and alert. She may need home hospice.  ASSESSMENT / PLAN:  PULMONARY A: Acute hypoxemic/hypercarbic respiratory failure 2/2 R PNA + atelectasis + effusion (parapneumonic). Differentials is CHF exacerbation, demand ischemia, possible mets from breast CA P:    DNR  comfort care CARDIOVASCULAR A:  Atrial fibrillation with rapid ventricular response Possible demand ischemia H/O Hypertension. Now with low BP  P:  DNR comfort care  RENAL A:   AKI DNR comfort care P:   DNR comfort care  GASTROINTESTINAL A:   Lower GIB (FOBT positive, no symptoms described)  P:   Start comfort feeds   HEMATOLOGIC A:   Anticoagulated on Xarelto  P:   DNR comfort care  INFECTIOUS A:   Possible community acquired pneumonia P:   DNR comfort care   ENDOCRINE A:   No acute issues P:   Full dnr   NEUROLOGIC A:   Acute metabolic encephalopathy, improved.  P:   Rass 0 Sedation if required for palliation  FAMILY  - Updates: 2 sons at bedside in the emergency department updated. They wish to continue aggressive medical treatment, however, should she suffer a cardiac arrest they would not want to pursue resuscitation. DO NOT RESUSCITATE ordered.  5/4 transferred to floor for palliation but awake and alert, not on any sedation, not bein fed. 5/5 consider tx to [facility].  Richardson Landry Minor ACNP [facility] PCCM Pager [phone] till 3 pm If no answer page [phone] 2017-01-20, 8:05 AM   ATTENDING NOTE / ATTESTATION NOTE :   I have discussed the case with the resident/APP  Richardson Landry Minor NP   I agree with the resident/APP's  history, physical examination, assessment, and plans.    I have edited the above note and modified it according to our agreed history, physical examination, assessment and plan.   Briefly, pt with fair fxna capacity, admitted for acute confusion.  She was dxed with PNA and was intubated.  When she woke up, she said she wanted to be DNR and comfort care.  Family was at bedside during discussion and she was transitioned to comfort care.  She was extubated on 5/4 and transferred to the floors.  On morphine drip. Appears comfortable.  Sleepy.  Denies complaints.  Will d/c all meds except for morphine drip.  Keep comfortable.  Reassess next 24  hrs.  Will transfer to [facility] service starting 5/6 and PCCM off then.  If pt expires before 5/6, pls let us know.  Family updated at bedside.    Monica Becton, MD Jan 20, 2017, 12:37 PM [facility] Pager [phone] After 3 pm or if no answer, call [phone]

## 2017-01-27 NOTE — Progress Notes (Signed)
Confirmed as second RN that pt has passed away.  No pulse, no respirations.  Family at bedside with pt.

## 2017-01-27 NOTE — Progress Notes (Signed)
   [facility] PCCM   > RN notifies me that pt expired at 3:47pm.  Family at bedside.    Monica Becton, MD 26-Jan-2017, 3:52 PM [facility] Pager [phone] After 3 pm or if no answer, call [phone]

## 2017-01-27 NOTE — Consult Note (Signed)
Consultation Note Date: 10-Jan-2017   Patient Name: Tina Wolfe  DOB: 1923/11/21  MRN: [id number]  Age / Sex: 83 y.o., female  PCP: Lavone Orn, MD Referring Physician: Javier Glazier, MD  Reason for Consultation: Establishing goals of care, Hospice Evaluation, Non pain symptom management, Pain control and Psychosocial/spiritual support  HPI/Patient Profile: 83 y.o. female  with past medical history of Atrial fibrillation, hypertension, recent spinal stress fracture admitted on 01/17/2017 after family found her almost unresponsive. She was in her usual state of health until approximately 4 days before admission when she developed confusion which progressed to more significant alteration of mental status. When her family went to check on her on 01/11/2017 she was nearly unresponsive. EMS was called and upon their arrival she remained unresponsive with shallow respirations. There was assisted ventilation with bag valve mask in Route. Per arrival to the emergency department she was able to talk some with the emergency staff but she continued to experience respiratory distress and then was intubated. Chest x-ray demonstrated a right sided opacification. Lactic acid level was 2.9. She was admitted to ICU  Pt was subsequently extubated and transferred to the floor. She refused a MS04 gtt . She is A/O x 3 and in no acute distress  Clinical Assessment and Goals of Care: Pt requested to be extubated and comfort care. She extubated without distress  Patient at this point is decisional. Her healthcare proxies would be her 2 sons    SUMMARY OF RECOMMENDATIONS   DO NOT RESUSCITATE DO NOT INTUBATE Monitor for clinical stability and is currently comfort care. I'm not clear at this point, since she is doing so well whether she is appropriate for hospice in the home or residential hospice. Palliative medicine team to stay  involved and assess over the weekend and make further recommendations as she has just been extubated  Code Status/Advance Care Planning:  DNR    Symptom Management:  Dyspnea/pain: Patient is refusing a morphine continuous infusion. States I don't want that strong stuff". Discussed the role of morphine to relieve dyspnea ,severe pain and she agreed for low-dose as needed morphine . We'll order Tylenol for mild to moderate pain Anxiety: We'll discontinue Versed as she is alert and oriented in no acute distress. She is requesting lorazepam; will order lorazepam 0.5 every 6 hours as needed both orally and IV  Palliative Prophylaxis:   Aspiration, Bowel Regimen, Delirium Protocol, Eye Care, Frequent Pain Assessment, Oral Care and Turn Reposition  Additional Recommendations (Limitations, Scope, Preferences):  Full Comfort Care  Psycho-social/Spiritual:   Desire for further Chaplaincy support:no   Prognosis:   Unable to determine  Discharge Planning: To Be Determined      Primary Diagnoses: Present on Admission: . Acute respiratory failure ([place])   I have reviewed the medical record, interviewed the patient and family, and examined the patient. The following aspects are pertinent.  Past Medical History:  Diagnosis Date  . A-fib ([place])   . Breast cancer, right, intraductal 01/20/1988  . Hypertension  Social History   Social History  . Marital status: Widowed    Spouse name: N/A  . Number of children: N/A  . Years of education: N/A   Social History Main Topics  . Smoking status: Former Research scientist (life sciences)  . Smokeless tobacco: Never Used  . Alcohol use No  . Drug use: No  . Sexual activity: Not Asked   Other Topics Concern  . None   Social History Narrative  . None   No family history on file. Scheduled Meds: Continuous Infusions: . sodium chloride     PRN Meds:.sodium chloride, acetaminophen, LORazepam, morphine injection Medications Prior to Admission:  Prior to  Admission medications   Medication Sig Start Date End Date Taking? Authorizing Provider  amLODipine (NORVASC) 5 MG tablet Take 5 mg by mouth daily.     Yes [provider]  lisinopril-hydrochlorothiazide (PRINZIDE,ZESTORETIC) 10-12.5 MG tablet Take 1 tablet by mouth daily.   Yes [provider]  LORazepam (ATIVAN) 0.5 MG tablet Take 0.5 mg by mouth at bedtime.   Yes [provider]  metoprolol succinate (TOPROL-XL) 25 MG 24 hr tablet Take 25 mg by mouth daily.   Yes [provider]  Multiple Vitamin (MULTIVITAMIN WITH MINERALS) TABS tablet Take 1 tablet by mouth daily.   Yes [provider]  Omega-3 Fatty Acids (FISH OIL) 1000 MG CAPS Take 1,000 mg by mouth daily.   Yes [provider]  oxyCODONE (ROXICODONE) 5 MG immediate release tablet Take 0.5 tablets (2.5 mg total) by mouth every 4 (four) hours as needed for severe pain. 12/08/16  Yes Deno Etienne, DO  raloxifene (EVISTA) 60 MG tablet Take 60 mg by mouth daily.     Yes [provider]  Rivaroxaban (XARELTO) 15 MG TABS tablet Take 1 tablet (15 mg total) by mouth daily with supper. 11/10/13  Yes Polite, Jori Moll, MD  digoxin (DIGOX) 0.25 MG tablet Take 1 tablet (250 mcg total) by mouth daily. Patient is taking 0.5 tab by mouth daily, so daily dose is 125 mcg. Patient not taking: Reported on 12/30/2016 01/30/15   Sherlene Shams, MD  lisinopril (PRINIVIL,ZESTRIL) 20 MG tablet Take 1 tablet (20 mg total) by mouth daily. Patient not taking: Reported on 12/30/2016 01/30/15   Sherlene Shams, MD   No Known Allergies Review of Systems  Unable to perform ROS: Other    Physical Exam  Constitutional: She is oriented to person, place, and time.  Frail, elderly female; alert and in no acute distress  Pulmonary/Chest: Effort normal.  Musculoskeletal: Normal range of motion.  Patient is lying on her side in somewhat of a fetal position. She is able to turn herself in bed but is not comfortable lying on  her back straight  Neurological: She is alert and oriented to person, place, and time.  Skin: Skin is warm and dry.  Psychiatric: She has a normal mood and affect. Her behavior is normal. Judgment and thought content normal.  Nursing note and vitals reviewed.   Vital Signs: BP (!) 66/40 (BP Location: Left Arm)   Pulse (!) 35   Temp 97.5 F (36.4 C) (Axillary)   Resp (!) 31   Ht 5\' 3"  (1.6 m)   Wt 53.8 kg (118 lb 9.7 oz)   SpO2 (!) 84%   BMI 21.01 kg/m  Pain Assessment: 0-10   Pain Score: Asleep   SpO2: SpO2: (!) 84 % O2 Device:SpO2: (!) 84 % O2 Flow Rate: .O2 Flow Rate (L/min): 3 L/min  IO: Intake/output  summary:  Intake/Output Summary (Last 24 hours) at 2017-01-24 1449 Last data filed at 01-24-17 3903  Gross per 24 hour  Intake                0 ml  Output               40 ml  Net              -40 ml    LBM: Last BM Date: 01-24-17 Baseline Weight: Weight: 51.7 kg (114 lb) Most recent weight: Weight: 53.8 kg (118 lb 9.7 oz)     Palliative Assessment/Data:   Flowsheet Rows     Most Recent Value  Intake Tab  Referral Department  Critical care  Unit at Time of Referral  Med/Surg Unit  Palliative Care Primary Diagnosis  Pulmonary  Date Notified  Jan 24, 2017  Palliative Care Type  New Palliative care  Reason for referral  Pain, Non-pain Symptom, Psychosocial or Spiritual support, Counsel Regarding Hospice  Date of Admission  01/17/2017  Date first seen by Palliative Care  2017-01-24  # of days Palliative referral response time  0 Day(s)  # of days IP prior to Palliative referral  2  Clinical Assessment  Palliative Performance Scale Score  40%  Pain Max last 24 hours  Not able to report  Pain Min Last 24 hours  Not able to report  Dyspnea Max Last 24 Hours  Not able to report  Dyspnea Min Last 24 hours  Not able to report  Nausea Max Last 24 Hours  Not able to report  Nausea Min Last 24 Hours  Not able to report  Anxiety Max Last 24 Hours  Not able to report  Anxiety  Min Last 24 Hours  Not able to report  Other Max Last 24 Hours  Not able to report  Psychosocial & Spiritual Assessment  Palliative Care Outcomes  Patient/Family meeting held?  Yes  Who was at the meeting?  pt and 2 sons  Palliative Care Outcomes  Clarified goals of care, Provided psychosocial or spiritual support, Improved pain interventions, Improved non-pain symptom therapy  Patient/Family wishes: Interventions discontinued/not started   Mechanical Ventilation, Hemodialysis, Vasopressors, Trach, Transfer out of ICU, PEG, Tube feedings/TPN  Palliative Care follow-up planned  Yes, Facility      Time In: 1430 Time Out: 1525 Time Total: 55 min Greater than 50%  of this time was spent counseling and coordinating care related to the above assessment and plan.  Signed by: Dory Horn, NP   Please contact Palliative Medicine Team phone at [phone] for questions and concerns.  For individual provider: See Shea Evans

## 2017-01-27 NOTE — Discharge Summary (Signed)
PULMONARY / CRITICAL CARE MEDICINE   Name: Tina Wolfe MRN: [id number] DOB: [birth year]    ADMISSION DATE:  01/06/2017 CONSULTATION DATE:  May 3  REFERRING MD:  Dr. Winfred Leeds  CHIEF COMPLAINT:  Altered mental status  HISTORY OF PRESENT ILLNESS:   83 year old female with past medical history as below, which is significant for atrial fibrillation on Xarelto, remote breast cancer in 1940s, hypertension, and recent diagnosis of several compression fractures in her spine. She was in her usual state of health until about 4 days ago when she developed confusion, which progressed to more significant alteration of mental status until 01/16/2017 when family went to check on her and she was nearly unresponsive. EMS was called and upon their arrival she remained unresponsive with shallow respirations. There assisted ventilations with bag valve mask in route. Upon arrival to the emergency department she was more awake and able to talk to the ED staff, however, due to her degree of respiratory distress she was intubated. Chest x-ray demonstrated a right sided opacification. Laboratory evaluation significant for elevated lactic acid 2.9, elevated transaminases, hyperkalemia, and elevated serum creatinine 1.34. She will be admitted to ICU.  PAST MEDICAL HISTORY :  She  has a past medical history of A-fib ([place]); Breast cancer, right, intraductal (01/20/1988); and Hypertension.  PAST SURGICAL HISTORY: She  has a past surgical history that includes Abdominal hysterectomy and Mastectomy.  No Known Allergies  No current facility-administered medications on file prior to encounter.    Current Outpatient Prescriptions on File Prior to Encounter  Medication Sig  . amLODipine (NORVASC) 5 MG tablet Take 5 mg by mouth daily.    . Multiple Vitamin (MULTIVITAMIN WITH MINERALS) TABS tablet Take 1 tablet by mouth daily.  . Omega-3 Fatty Acids (FISH OIL) 1000 MG CAPS Take 1,000 mg by mouth daily.  Marland Kitchen oxyCODONE  (ROXICODONE) 5 MG immediate release tablet Take 0.5 tablets (2.5 mg total) by mouth every 4 (four) hours as needed for severe pain.  . raloxifene (EVISTA) 60 MG tablet Take 60 mg by mouth daily.    . Rivaroxaban (XARELTO) 15 MG TABS tablet Take 1 tablet (15 mg total) by mouth daily with supper.  . digoxin (DIGOX) 0.25 MG tablet Take 1 tablet (250 mcg total) by mouth daily. Patient is taking 0.5 tab by mouth daily, so daily dose is 125 mcg. (Patient not taking: Reported on 12/30/2016)  . lisinopril (PRINIVIL,ZESTRIL) 20 MG tablet Take 1 tablet (20 mg total) by mouth daily. (Patient not taking: Reported on 12/30/2016)  . [DISCONTINUED] metoprolol tartrate (LOPRESSOR) 25 MG tablet Take 1 tablet (25 mg total) by mouth 2 (two) times daily. (Patient not taking: Reported on 10/30/2014)    FAMILY HISTORY:  Her indicated that her mother is deceased. She indicated that her father is deceased. She indicated that only one of her two sisters is alive.    SOCIAL HISTORY: She  reports that she has quit smoking. She has never used smokeless tobacco. She reports that she does not drink alcohol or use drugs.  REVIEW OF SYSTEMS:   Unable as patient is intubated and encephalopathic  SUBJECTIVE:    VITAL SIGNS: BP (!) 66/40 (BP Location: Left Arm)   Pulse (!) 35   Temp 97.5 F (36.4 C) (Axillary)   Resp (!) 31   Ht 5\' 3"  (1.6 m)   Wt 53.8 kg (118 lb 9.7 oz)   SpO2 (!) 84%   BMI 21.01 kg/m   HEMODYNAMICS:    VENTILATOR SETTINGS:  INTAKE / OUTPUT: I/O last 3 completed shifts: In: 3350.4 [I.V.:1549.4; IV Piggyback:1801] Out: 75 [Urine:75]  PHYSICAL EXAMINATION: General:  Frail elderly female on ventilator Neuro:  Sedated HEENT:  [place]/AT, PERRL, mild JVD Cardiovascular:  Irregularly irregular, tachycardic. Trace peripheral edema. Lungs:  Diminished right Abdomen:  Soft, nondistended Musculoskeletal:  No acute deformity Skin:  Grossly intact  LABS:  BMET  Recent Labs Lab 01/13/2017 2127  12/30/16 0403  NA 138 136  K 5.3* 4.3  CL 98* 104  CO2 29 20*  BUN 59* 54*  CREATININE 1.34* 1.21*  GLUCOSE 126* 133*    Electrolytes  Recent Labs Lab 01/15/2017 2127 12/30/16 0403  CALCIUM 8.7* 7.2*  MG  --  2.2  PHOS  --  3.7    CBC  Recent Labs Lab 12/30/2016 2127 12/30/16 0403  WBC 8.3 14.3*  HGB 14.1 13.3  HCT 45.4 42.2  PLT 120* 116*    Coag's  Recent Labs Lab 12/30/16 0126  APTT 33    Sepsis Markers  Recent Labs Lab 01/08/2017 2144 12/30/16 0223  LATICACIDVEN 2.96* 2.3*  PROCALCITON  --  <0.10    ABG  Recent Labs Lab 01/17/2017 2239 12/30/16 0116  PHART 7.337* 7.390  PCO2ART 48.6* 39.4  PO2ART 84.0 198.0*    Liver Enzymes  Recent Labs Lab 01/08/2017 2127  AST 285*  ALT 266*  ALKPHOS 36*  BILITOT 1.3*  ALBUMIN 3.2*    Cardiac Enzymes No results for input(s): TROPONINI, PROBNP in the last 168 hours.  Glucose  Recent Labs Lab 01/04/2017 2131 12/30/16 0106  GLUCAP 108* 80    Imaging No results found.   STUDIES:    CULTURES: Blood 5/4 > Urine 5/4 > Tracheal asp 5/4 >  ANTIBIOTICS: Levaquin 5/4 >  SIGNIFICANT EVENTS:   LINES/TUBES: ETT 5/4 >  DISCUSSION: 83 year old female recently diagnosed with L spine stress fractures. Progressive alteration of mental status over a 4 day period. Presented to emergency Department 5/3 essentially unresponsive. She was placed on the ventilator for respiratory distress and admitted to the ICU with right lung collapse secondary to large effusion and potentially some postobstructive atelectasis.  ASSESSMENT / PLAN:  PULMONARY A: Acute hypoxemic/hypercarbic respiratory failure secondary to right-sided pleural effusion and resultant atelectasis. Etiology unclear but differential includes pneumonia with parapneumonic effusion, CHF, and malignant effusion considering history of breast cancer.  P:   Full vent support Wean FiO2 to maintain SPO2 greater than 92% VAP bundle Follow ABG  and chest x-ray Empiric antibiotics as above Will need to evaluate effusion with ultrasound for thoracentesis in the morning Would favor CT scan after thoracentesis to assess for mass  CARDIOVASCULAR A:  Atrial fibrillation with rapid ventricular response heart rate currently 100 -115 Hypertension  P:  Telemetry monitoring in ICU When necessary metoprolol IV Echocardiogram BNP Holding home Xarelto in favor of heparin infusion per pharmacy.  RENAL A:   AKI  P:   IV fluid hydration Follow urine output Follow BMP  GASTROINTESTINAL A:   Lower GIB (FOBT positive, no symptoms described)  P:   Nothing by mouth IV Protonix for stress ulcer prophylaxis Monitor for signs and symptoms of bleeding  HEMATOLOGIC A:   Anticoagulated on Xarelto  P:  Heparin infusion for A. Fib per pharmacy Follow CBC  INFECTIOUS A:   Possible Immunity acquired pneumonia P:   Antibiotics as above Follow cultures Assess Procalcitonin  ENDOCRINE A:   No acute issues P:   Follow glucose on chemistry  NEUROLOGIC A:  Acute metabolic encephalopathy P:   RASS goal: -1 to -2 PRN fentanyl/versed Consider CT head  FAMILY  - Updates: 2 sons at bedside in the emergency department updated. They wish to continue aggressive medical treatment, however, should she suffer a cardiac arrest they would not want to pursue resuscitation. DO NOT RESUSCITATE ordered.  - Inter-disciplinary family meet or Palliative Care meeting due by:  5/11  APP critical care time 45 mins.  Georgann Housekeeper, AGACNP-BC [facility] Pager [phone] or [phone]  01-26-17 3:53 PM    Addendum : pt stayed in the ICU.  She was treated as PNA + volume overload.  She woke up and said she did not want to be intubated.  She was made DNR, transitioning to comfort care.  Family was around during discussion and was in agreement. Pls see last PN :   PULMONARY / CRITICAL CARE  MEDICINE   Name: Tina Wolfe MRN: [id number] DOB: [birth year]    ADMISSION DATE:  01/25/2017 CONSULTATION DATE:  May 3  REFERRING MD:  Dr. Winfred Leeds  CHIEF COMPLAINT:  Altered mental status  HISTORY OF PRESENT ILLNESS:   83 year old female with past medical history as below, which is significant for atrial fibrillation on Xarelto, remote breast cancer in 1940s, hypertension, and recent diagnosis of several compression fractures in her spine. She was in her usual state of health until about 4 days ago when she developed confusion, which progressed to more significant alteration of mental status until 01/01/2017 when family went to check on her and she was nearly unresponsive. EMS was called and upon their arrival she remained unresponsive with shallow respirations. There assisted ventilations with bag valve mask in route. Upon arrival to the emergency department she was more awake and able to talk to the ED staff, however, due to her degree of respiratory distress she was intubated. Chest x-ray demonstrated a right sided opacification. Laboratory evaluation significant for elevated lactic acid 2.9, elevated transaminases, hyperkalemia, and elevated serum creatinine 1.34. She will be admitted to ICU.   SUBJECTIVE:  On floor for palliation but awake and alert  VITAL SIGNS: BP (!) 66/40 (BP Location: Left Arm)   Pulse (!) 35   Temp 97.5 F (36.4 C) (Axillary)   Resp (!) 31   Ht 5\' 3"  (1.6 m)   Wt 53.8 kg (118 lb 9.7 oz)   SpO2 (!) 84%   BMI 21.01 kg/m   HEMODYNAMICS:    VENTILATOR SETTINGS:    INTAKE / OUTPUT: I/O last 3 completed shifts: In: 3350.4 [I.V.:1549.4; IV Piggyback:1801] Out: 75 [Urine:75]  PHYSICAL EXAMINATION: General:  Frail female NAD at rest HEENT: MM pink/moist PSY:nl affect Neuro: intact CV: s1s2 rrr, no m/r/g PULM: even/non-labored, lungs bilaterally decreased GD:JMEQ, non-tender, bsx4 active  Extremities: warm/dry, + edema  Skin: no rashes or  lesions   LABS:  BMET  Recent Labs Lab 01/24/2017 2127 12/30/16 0403  NA 138 136  K 5.3* 4.3  CL 98* 104  CO2 29 20*  BUN 59* 54*  CREATININE 1.34* 1.21*  GLUCOSE 126* 133*    Electrolytes  Recent Labs Lab 01/07/2017 2127 12/30/16 0403  CALCIUM 8.7* 7.2*  MG  --  2.2  PHOS  --  3.7    CBC  Recent Labs Lab 01/07/2017 2127 12/30/16 0403  WBC 8.3 14.3*  HGB 14.1 13.3  HCT 45.4 42.2  PLT 120* 116*    Coag's  Recent Labs Lab 12/30/16 0126  APTT 33    Sepsis Markers  Recent Labs Lab 01/20/2017 2144 12/30/16 0223  LATICACIDVEN 2.96* 2.3*  PROCALCITON  --  <0.10    ABG  Recent Labs Lab 01/20/2017 2239 12/30/16 0116  PHART 7.337* 7.390  PCO2ART 48.6* 39.4  PO2ART 84.0 198.0*    Liver Enzymes  Recent Labs Lab 01/25/2017 2127  AST 285*  ALT 266*  ALKPHOS 36*  BILITOT 1.3*  ALBUMIN 3.2*    Cardiac Enzymes No results for input(s): TROPONINI, PROBNP in the last 168 hours.  Glucose  Recent Labs Lab 01/03/2017 2131 12/30/16 0106  GLUCAP 108* 80    Imaging No results found.   STUDIES:    CULTURES: Blood 5/4 > Urine 5/4 > Tracheal asp 5/4 >  ANTIBIOTICS: Levaquin 5/4 >5/4  SIGNIFICANT EVENTS:   LINES/TUBES: ETT 5/4 >  DISCUSSION: 83 year old female recently diagnosed with L spine stress fractures. Progressive alteration of mental status over a 4 day period. Presented to emergency Department 5/3 essentially unresponsive. She was placed on the ventilator for respiratory distress and admitted to the ICU with right lung collapse secondary to large effusion and potentially some postobstructive atelectasis. 5/4 full dnr with palliation. Not on drips, awake and alert. She may need home hospice.  ASSESSMENT / PLAN:  PULMONARY A: Acute hypoxemic/hypercarbic respiratory failure 2/2 R PNA + atelectasis + effusion (parapneumonic). Differentials is CHF exacerbation, demand ischemia, possible mets from breast CA P:    DNR comfort  care CARDIOVASCULAR A:  Atrial fibrillation with rapid ventricular response Possible demand ischemia H/O Hypertension. Now with low BP  P:  DNR comfort care  RENAL A:   AKI DNR comfort care P:   DNR comfort care  GASTROINTESTINAL A:   Lower GIB (FOBT positive, no symptoms described)  P:   Start comfort feeds   HEMATOLOGIC A:   Anticoagulated on Xarelto  P:   DNR comfort care  INFECTIOUS A:   Possible community acquired pneumonia P:   DNR comfort care   ENDOCRINE A:   No acute issues P:   Full dnr   NEUROLOGIC A:   Acute metabolic encephalopathy, improved.  P:   Rass 0 Sedation if required for palliation  FAMILY  - Updates: 2 sons at bedside in the emergency department updated. They wish to continue aggressive medical treatment, however, should she suffer a cardiac arrest they would not want to pursue resuscitation. DO NOT RESUSCITATE ordered.  5/4 transferred to floor for palliation but awake and alert, not on any sedation, not bein fed. 5/5 consider tx to [facility].  Richardson Landry Minor ACNP [facility] Pager [phone] till 3 pm If no answer page [phone] Jan 13, 2017, 3:55 PM   ATTENDING NOTE / ATTESTATION NOTE :   I have discussed the case with the resident/APP  Richardson Landry Minor NP   I agree with the resident/APP's  history, physical examination, assessment, and plans.    I have edited the above note and modified it according to our agreed history, physical examination, assessment and plan.   Briefly, pt with fair fxna capacity, admitted for acute confusion.  She was dxed with PNA and was intubated.  When she woke up, she said she wanted to be DNR and comfort care.  Family was at bedside during discussion and she was transitioned to comfort care.  She was extubated on 5/4 and transferred to the floors.  On morphine drip. Appears comfortable.  Sleepy.  Denies complaints.  Will d/c all meds except for morphine drip.  Keep comfortable.  Reassess next 24 hrs.   Will  transfer to [facility] service starting 5/6 and PCCM off then.  If pt expires before 5/6, pls let us know.  Family updated at bedside.    Monica Becton, MD 01/06/2017, 3:55 PM [facility] Pager [phone] After 3 pm or if no answer, call [phone]   Addendum 5/5 : pt was pronounced dead at 3:47pm. Family at bedside.    Monica Becton, MD 01-06-17, 3:56 PM  Pulmonary and Critical Care Pager [phone] After 3 pm or if no answer, call [phone]

## 2017-01-27 NOTE — Progress Notes (Signed)
Called to patients room by family, upon arrival family states "I believe she has passed, can you check". Assessment revealed no pulse and no respirations. Called for second nurse verification. Also called Primary MD Dr. Corrie Dandy and CDS.

## 2017-01-27 DEATH — deceased

## 2019-01-04 IMAGING — CT CT L SPINE W/O CM
4 of 6 series · 16 of 33 positions shown, 18 images · non-contrast
Comparison: Lumbar spine radiographs 10/07/2013. CT Abdomen and
Pelvis today (reported separately) and 10/15/2008

CLINICAL DATA: [AGE] female with lumbar back pain for 5 days
becoming severe.

EXAM:
CT LUMBAR SPINE WITHOUT CONTRAST
TECHNIQUE: Multidetector CT imaging of the lumbar spine was performed without
intravenous contrast administration. Multiplanar CT image
reconstructions were also generated.

[Series 2010: axial st · axial · 0.45mm/px · z∈[+242,+380]mm · 5 of 109 slices shown, 7 images]
[im 19/109  soft-tissue]
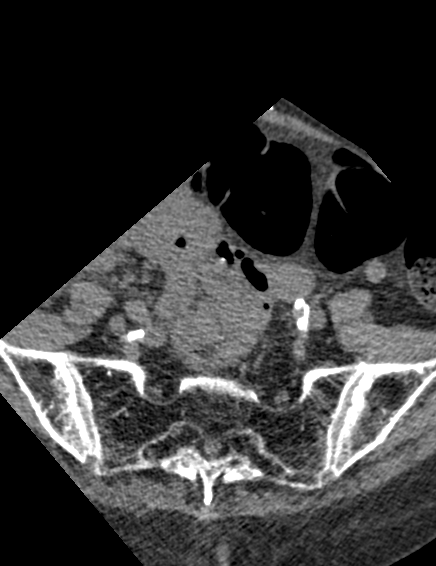
[im 19/109  bone]
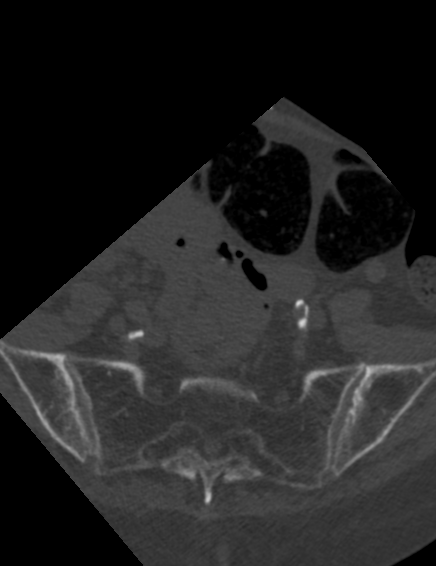
[im 37/109  bone]
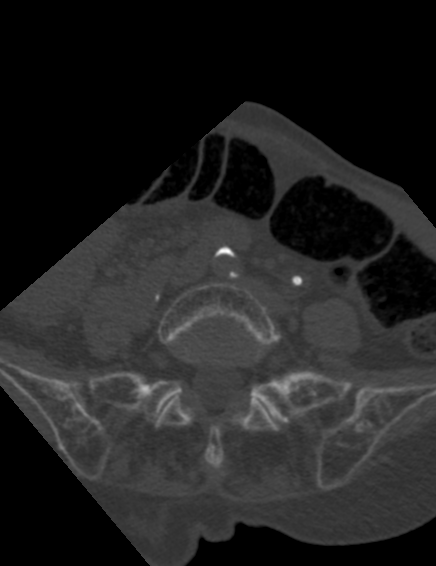
[im 55/109  bone]
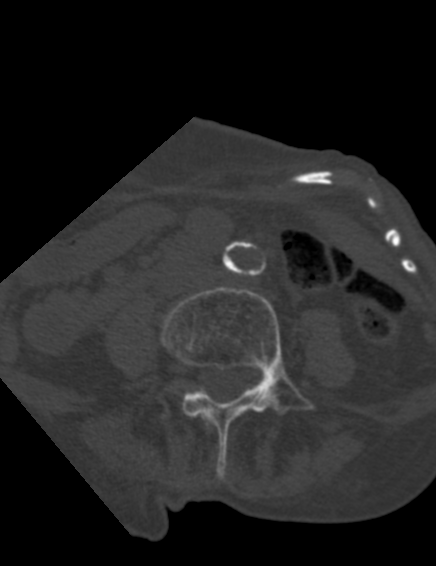
[im 73/109  bone]
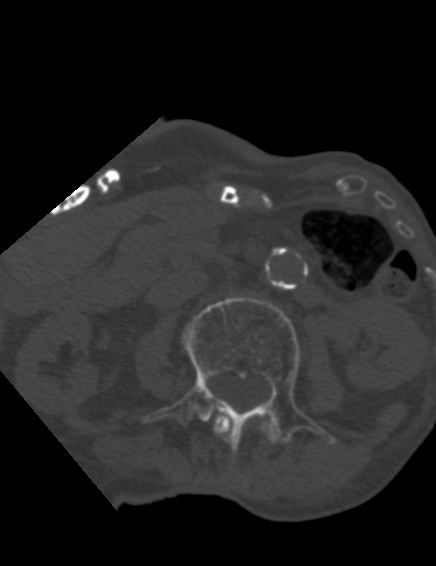
[im 91/109  soft-tissue]
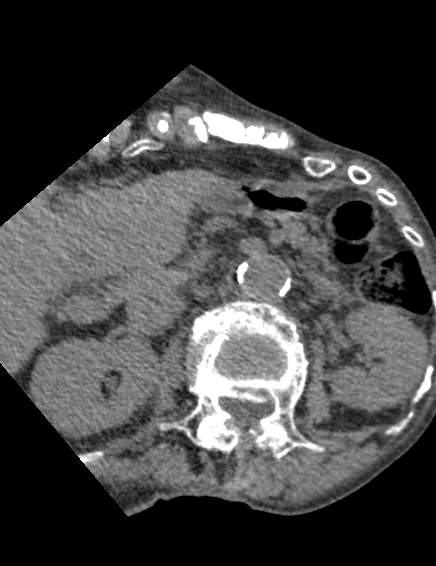
[im 91/109  bone]
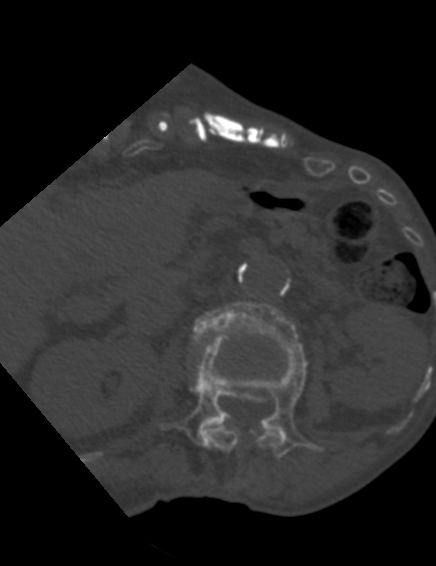

[Series 2011: axial bone · axial · 0.45mm/px · z∈[+242,+380]mm · 5 of 109 slices shown]
[im 19/109  bone]
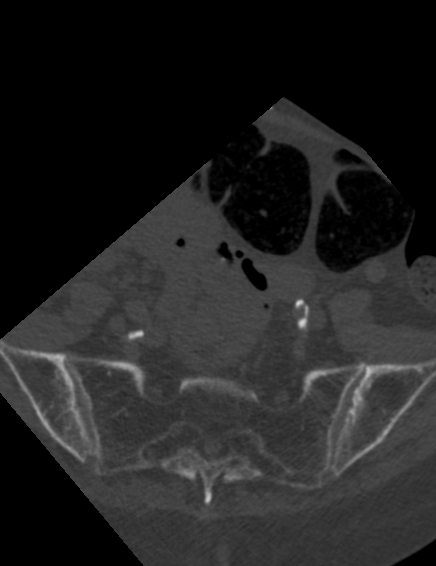
[im 37/109  bone]
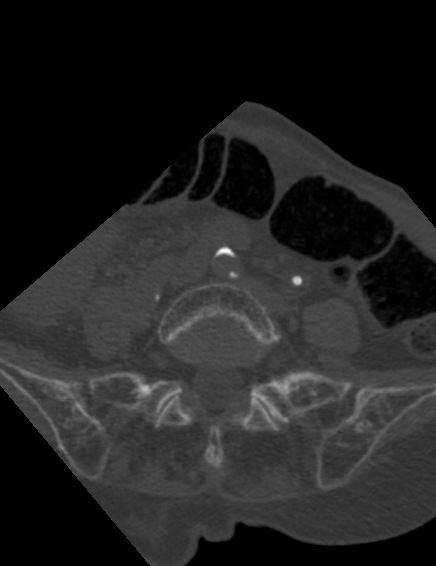
[im 55/109  bone]
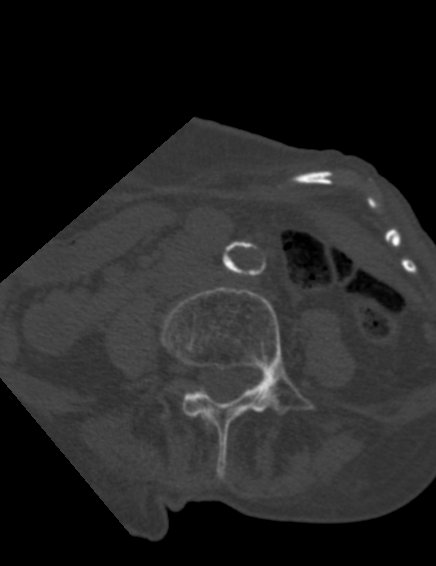
[im 73/109  bone]
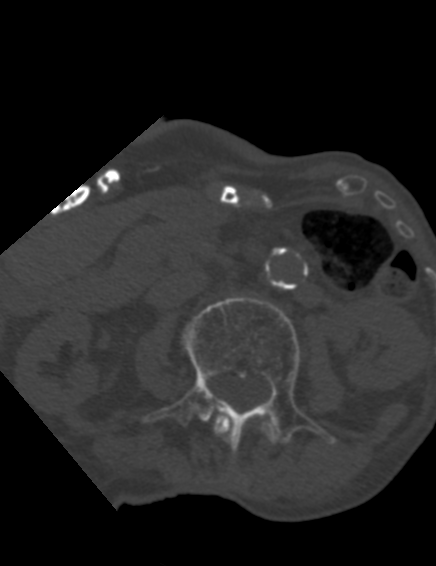
[im 91/109  bone]
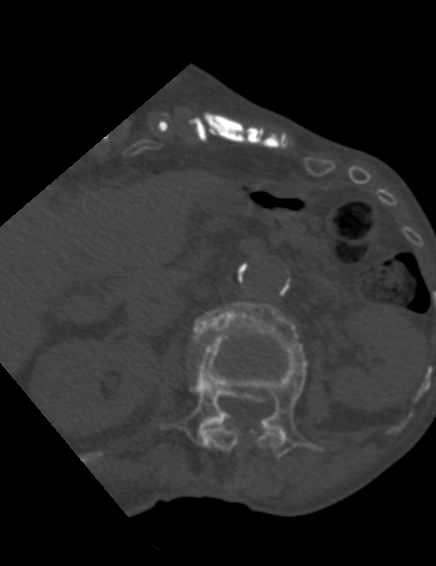

[Series 2012: cor bone · coronal · 0.45mm/px · 1 of 62 slices shown]
[im 31/62  bone]
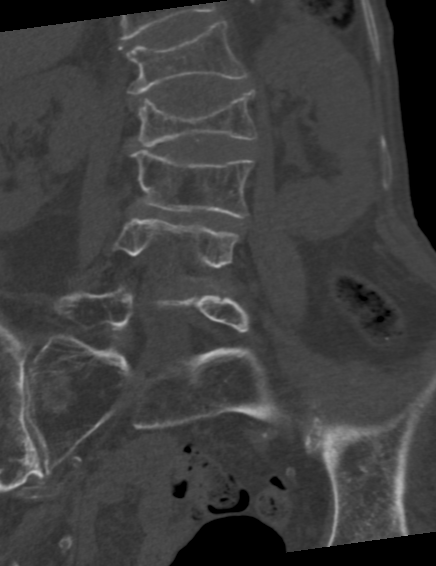

[Series 2014: sag st · sagittal · 0.45mm/px · 5 of 73 slices shown]
[im 13/73  bone]
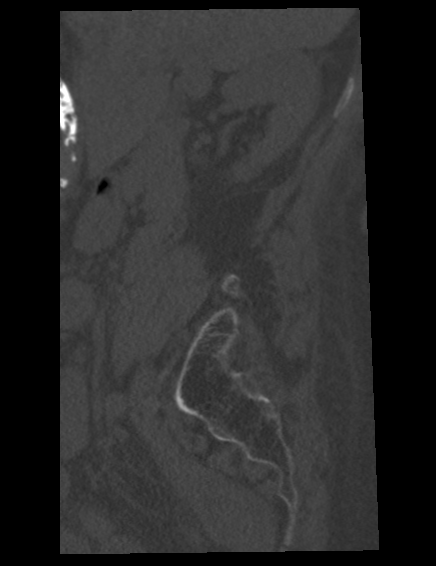
[im 25/73  bone]
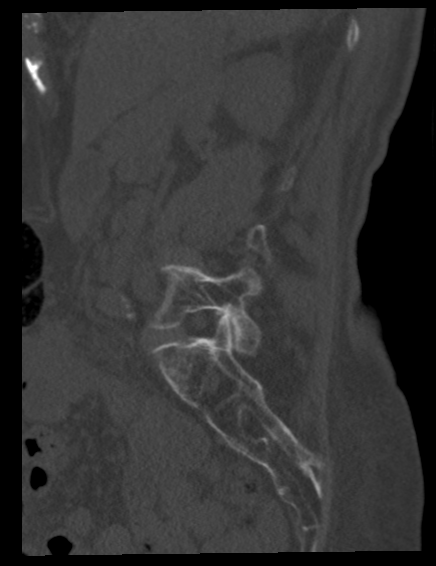
[im 37/73  bone]
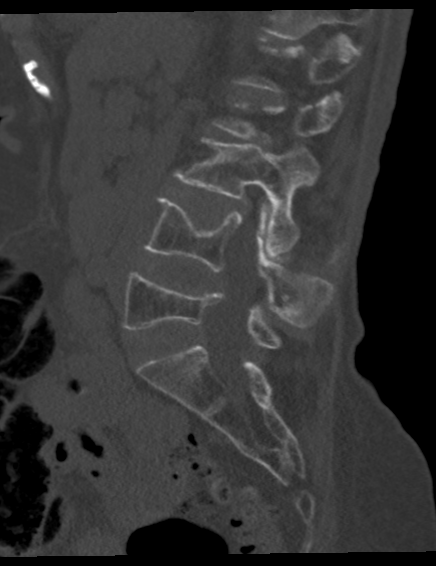
[im 49/73  bone]
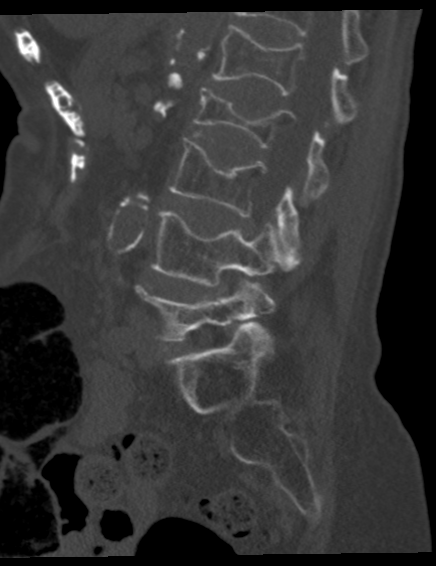
[im 61/73  bone]
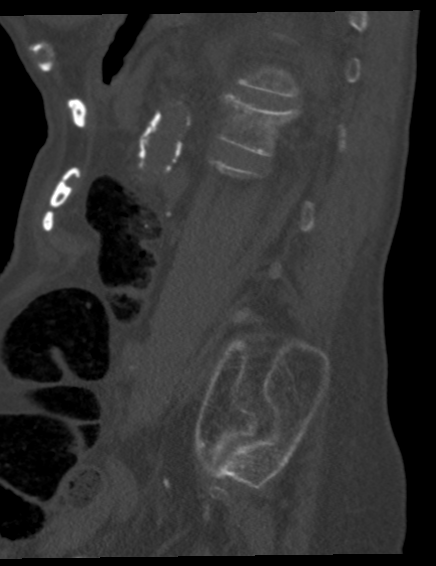

[16 of 33 positions shown; findings below may reference images not displayed]

FINDINGS: Segmentation: Normal is demonstrated on the 0177 radiographs.

Alignment: Chronic levoconvex lumbar scoliosis and straightening of
lumbar lordosis.

Vertebrae: Osteopenia. Chronic T12 through L4 compression fractures.
The T12 level it is incompletely visualized on these images.

The L3 and L4 levels appears stable since 8909.

The L1 level demonstrates further compression and loss of height
since 8909, and there is un healed lucency of the anterior superior
L1 endplate best demonstrated on sagittal image 42. Nondisplaced
fracture of the right L1 pedicle (sagittal image 40). The other L1
posterior elements appear intact. Mild retropulsion of the posterior
inferior endplate.

Severe L2 vertebral body loss of height, progressed since 8909.
Still, it is unclear whether there is any un healed lucency at the
L2 vertebral body (questionably on sagittal image 48). The L2
posterior elements appear intact. Chronic retropulsion of the
posterosuperior L2 endplate.

Moderate to severe L5 compression fracture is new since 8909 but was
present on the 0177 radiographs although there may have been further
loss of height since that time. Retropulsion of the posterosuperior
L5 endplate. Questionable nondisplaced fracture of the left L5
pedicle on sagittal image 49. Otherwise the L5 posterior elements
appear intact.

Visible sacrum and SI joints appear intact.

Paraspinal and other soft tissues: Calcified aortic atherosclerosis.
Abdominal and pelvic visceral details are reported separately today
with the CT Abdomen and Pelvis. Posterior paraspinal soft tissues
are within normal limits.

Disc levels: Multilevel lumbar spinal stenosis is primarily related
to compression fracture retropulsion. Moderate spinal stenosis at
the L2 superior endplate level. Mild to moderate spinal stenosis at
the L3-L4 level. Mild to moderate spinal stenosis at the L4-L5
level.
IMPRESSION: 1. Osteopenia and diffuse lumbar compression fractures. Of these the
L1 fracture appears most recent, but L2 and L5 also have progressed
since the most recent comparison in 0177. If specific therapy such
as vertebroplasty is desired, Lumbar MRI or Nuclear Medicine
Whole-body Bone Scan would best determine acuity.
2. Mild L1 bony retropulsion and nondisplaced right L1 pedicle
fracture. More pronounced bony retropulsion at the other compression
fractures. Possible nondisplaced left L5 pedicle fracture.
3. Up to moderate multifactorial lumbar spinal stenosis at L1-L2,
L3-L4, and L4-L5.
4.  CT Abdomen and Pelvis from today reported separately.
